# Patient Record
Sex: Female | Born: 1995 | Race: Black or African American | Hispanic: No | Marital: Single | Smoking: Never smoker
Health system: Southern US, Community
[De-identification: ages and names within clinical notes are randomized; demographics above are authoritative.]

## PROBLEM LIST (undated history)

## (undated) ENCOUNTER — Encounter

## (undated) ENCOUNTER — Encounter: Attending: Family | Primary: Family

## (undated) ENCOUNTER — Telehealth: Attending: Ambulatory Care | Primary: Ambulatory Care

## (undated) ENCOUNTER — Telehealth: Attending: Internal Medicine | Primary: Internal Medicine

## (undated) ENCOUNTER — Encounter: Attending: Ambulatory Care | Primary: Ambulatory Care

## (undated) ENCOUNTER — Telehealth

## (undated) ENCOUNTER — Encounter: Attending: Internal Medicine | Primary: Internal Medicine

## (undated) ENCOUNTER — Telehealth: Attending: Family | Primary: Family

## (undated) ENCOUNTER — Ambulatory Visit: Payer: TRICARE (CHAMPUS)

## (undated) ENCOUNTER — Ambulatory Visit

## (undated) ENCOUNTER — Encounter: Attending: Neurology | Primary: Neurology

## (undated) ENCOUNTER — Encounter: Attending: Advanced Practice Midwife | Primary: Advanced Practice Midwife

## (undated) ENCOUNTER — Ambulatory Visit: Payer: TRICARE (CHAMPUS) | Attending: Family Medicine | Primary: Family Medicine

## (undated) ENCOUNTER — Encounter: Payer: TRICARE (CHAMPUS) | Attending: Internal Medicine | Primary: Internal Medicine

## (undated) ENCOUNTER — Ambulatory Visit
Payer: TRICARE (CHAMPUS) | Attending: Student in an Organized Health Care Education/Training Program | Primary: Student in an Organized Health Care Education/Training Program

## (undated) ENCOUNTER — Ambulatory Visit: Payer: TRICARE (CHAMPUS) | Attending: Family | Primary: Family

## (undated) DIAGNOSIS — IMO0002 Reserved for concepts with insufficient information to code with codable children: Secondary | ICD-10-CM

## (undated) DIAGNOSIS — M329 Systemic lupus erythematosus, unspecified: Secondary | ICD-10-CM

## (undated) HISTORY — PX: APPENDECTOMY: SHX54

---
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
## 2019-03-07 ENCOUNTER — Encounter (HOSPITAL_COMMUNITY): Payer: Self-pay | Admitting: Emergency Medicine

## 2019-03-07 ENCOUNTER — Emergency Department (HOSPITAL_COMMUNITY)
Admission: EM | Admit: 2019-03-07 | Discharge: 2019-03-07 | Disposition: A | Discharge status: Home / Self Care | Attending: Emergency Medicine | Admitting: Emergency Medicine

## 2019-03-07 ENCOUNTER — Other Ambulatory Visit: Payer: Self-pay

## 2019-03-07 DIAGNOSIS — R300 Dysuria: Secondary | ICD-10-CM | POA: Insufficient documentation

## 2019-03-07 DIAGNOSIS — L298 Other pruritus: Secondary | ICD-10-CM | POA: Diagnosis not present

## 2019-03-07 DIAGNOSIS — R102 Pelvic and perineal pain: Secondary | ICD-10-CM | POA: Insufficient documentation

## 2019-03-07 LAB — URINALYSIS, COMPLETE (UACMP) WITH MICROSCOPIC
Bilirubin Urine: NEGATIVE
Glucose, UA: NEGATIVE mg/dL
Hgb urine dipstick: NEGATIVE
Ketones, ur: NEGATIVE mg/dL
Leukocytes,Ua: NEGATIVE
Nitrite: NEGATIVE
Protein, ur: NEGATIVE mg/dL
Specific Gravity, Urine: 1.019 (ref 1.005–1.030)
pH: 7 (ref 5.0–8.0)

## 2019-03-07 LAB — I-STAT BETA HCG BLOOD, ED (MC, WL, AP ONLY): I-stat hCG, quantitative: <5 m[IU]/mL (ref <5)

## 2019-03-07 LAB — WET PREP, GENITAL
Clue Cells Wet Prep HPF POC: NONE SEEN
Sperm: NONE SEEN
Trich, Wet Prep: NONE SEEN
Yeast Wet Prep HPF POC: NONE SEEN

## 2019-03-07 NOTE — Discharge Instructions (Signed)
You were evaluated in the Emergency Department and after careful evaluation, we did not find any emergent condition requiring admission or further testing in the hospital.  Your exam/testing today is overall reassuring.  You will be called with any abnormal test results.  We recommend follow-up with a GYN doctor.  Please return to the Emergency Department if you experience any worsening of your condition.  We encourage you to follow up with a primary care provider.  Thank you for allowing [place] to be a part of your care.

## 2019-03-07 NOTE — ED Provider Notes (Signed)
[facility]-EMERGENCY DEPT [facility] Emergency Department Provider Note MRN:  [id number]  Arrival date & time: 03/07/19     Chief Complaint   Vaginal Pain   History of Present Illness   Loany Neuroth is a 23 y.o. year-old female with no pertinent past medical history presenting to the ED with chief complaint of vaginal pain.  Vaginal discomfort, itching, burning with urination.  Unprotected sex 6 days ago.  No bleeding.  No fever.  No abdominal pain.  Pain is mild, constant, no exacerbating or alleviating factors.  Review of Systems  A problem-focused ROS was performed. Positive for vaginal pain.  Patient denies fever.  Patient's Health History   History reviewed. No pertinent past medical history.  History reviewed. No pertinent surgical history.  History reviewed. No pertinent family history.  Social History   Socioeconomic History  . Marital status: Single    Spouse name: Not on file  . Number of children: Not on file  . Years of education: Not on file  . Highest education level: Not on file  Occupational History  . Not on file  Social Needs  . Financial resource strain: Not on file  . Food insecurity    Worry: Not on file    Inability: Not on file  . Transportation needs    Medical: Not on file    Non-medical: Not on file  Tobacco Use  . Smoking status: Never Smoker  . Smokeless tobacco: Never Used  Substance and Sexual Activity  . Alcohol use: Yes  . Drug use: Never  . Sexual activity: Not on file  Lifestyle  . Physical activity    Days per week: Not on file    Minutes per session: Not on file  . Stress: Not on file  Relationships  . Social Musician on phone: Not on file    Gets together: Not on file    Attends religious service: Not on file    Active member of club or organization: Not on file    Attends meetings of clubs or organizations: Not on file    Relationship status: Not on file  . Intimate partner violence    Fear of current or  ex partner: Not on file    Emotionally abused: Not on file    Physically abused: Not on file    Forced sexual activity: Not on file  Other Topics Concern  . Not on file  Social History Narrative  . Not on file     Physical Exam  Vital Signs and Nursing Notes reviewed Vitals:   03/07/19 0048  BP: 107/70  Pulse: 83  Resp: 16  Temp: 98.9 F (37.2 C)  SpO2: 100%    CONSTITUTIONAL: Well-appearing, NAD NEURO:  Alert and oriented x 3, no focal deficits EYES:  eyes equal and reactive ENT/NECK:  no LAD, no JVD CARDIO: Regular rate, well-perfused, normal S1 and S2 PULM:  CTAB no wheezing or rhonchi GI/GU:  normal bowel sounds, non-distended, non-tender; normal-appearing external genitalia, no adnexal masses or tenderness, small amount of erythema to the cervix with surrounding white discharge MSK/SPINE:  No gross deformities, no edema SKIN:  no rash, atraumatic PSYCH:  Appropriate speech and behavior  Diagnostic and Interventional Summary    EKG Interpretation  Date/Time:    Ventricular Rate:    PR Interval:    QRS Duration:   QT Interval:    QTC Calculation:   R Axis:     Text Interpretation:  Labs Reviewed  WET PREP, GENITAL - Abnormal; Notable for the following components:      Result Value   WBC, Wet Prep HPF POC FEW (*)    All other components within normal limits  URINALYSIS, COMPLETE (UACMP) WITH MICROSCOPIC - Abnormal; Notable for the following components:   Bacteria, UA RARE (*)    All other components within normal limits  I-STAT BETA HCG BLOOD, ED ([facility], [facility], [facility] ONLY)  GC/CHLAMYDIA PROBE AMP ([place]) NOT AT [facility]    No orders to display    Medications - No data to display   Procedures  /  Critical Care Procedures  ED Course and Medical Decision Making  I have reviewed the triage vital signs and the nursing notes.  Pertinent labs & imaging results that were available during my care of the patient were reviewed by me and considered in my  medical decision making (see below for details).     Small amount of discharge and cervical irritation, exam not impressive enough to suggest PID but considering GC/chlamydia, trichomonas, BV.  Wet prep pending.  No clue cells on wet prep, appropriate for discharge.  Barth Kirks. Sedonia Small, MD [place] [email or web address]  Final Clinical Impressions(s) / ED Diagnoses     ICD-10-CM   1. Vaginal pain  R10.2     ED Discharge Orders    None       Discharge Instructions Discussed with and Provided to Patient:     Discharge Instructions     You were evaluated in the Emergency Department and after careful evaluation, we did not find any emergent condition requiring admission or further testing in the hospital.  Your exam/testing today is overall reassuring.  You will be called with any abnormal test results.  We recommend follow-up with a GYN doctor.  Please return to the Emergency Department if you experience any worsening of your condition.  We encourage you to follow up with a primary care provider.  Thank you for allowing [place] to be a part of your care.       Maudie Flakes, MD 03/07/19 [phone]

## 2019-03-07 NOTE — ED Triage Notes (Signed)
Patient complaining of vaginal pain, itching, burning when urinating, and patient had unprotected sex. Patient had unprotected sex on Friday.

## 2019-03-07 NOTE — ED Notes (Addendum)
Pt has the following tubes in Main lab:  3 gold tops 1 light green 1 lavender

## 2019-03-11 LAB — GC/CHLAMYDIA PROBE AMP (~~LOC~~) NOT AT ARMC
Chlamydia: NEGATIVE
Neisseria Gonorrhea: NEGATIVE

## 2019-11-04 DIAGNOSIS — M329 Systemic lupus erythematosus, unspecified: Principal | ICD-10-CM

## 2019-11-19 ENCOUNTER — Ambulatory Visit: Admit: 2019-11-19 | Discharge: 2019-11-20 | Payer: TRICARE (CHAMPUS)

## 2019-11-19 ENCOUNTER — Encounter: Admit: 2019-11-19 | Discharge: 2019-11-20 | Payer: TRICARE (CHAMPUS)

## 2019-11-19 DIAGNOSIS — R768 Other specified abnormal immunological findings in serum: Principal | ICD-10-CM

## 2019-11-19 DIAGNOSIS — M2550 Pain in unspecified joint: Principal | ICD-10-CM

## 2019-11-19 DIAGNOSIS — M329 Systemic lupus erythematosus, unspecified: Principal | ICD-10-CM

## 2019-11-22 DIAGNOSIS — M3219 Other organ or system involvement in systemic lupus erythematosus: Principal | ICD-10-CM

## 2019-11-22 MED ORDER — HYDROXYCHLOROQUINE 200 MG TABLET
ORAL_TABLET | Freq: Every day | ORAL | 2 refills | 30.00 days | Status: CP
Start: 2019-11-22 — End: 2020-02-20

## 2019-12-05 ENCOUNTER — Ambulatory Visit: Admit: 2019-12-05 | Discharge: 2019-12-06 | Payer: TRICARE (CHAMPUS)

## 2019-12-05 ENCOUNTER — Encounter: Admit: 2019-12-05 | Discharge: 2019-12-06 | Payer: TRICARE (CHAMPUS)

## 2019-12-06 DIAGNOSIS — D72819 Decreased white blood cell count, unspecified: Principal | ICD-10-CM

## 2019-12-06 MED ORDER — PREDNISONE 5 MG TABLET
ORAL_TABLET | Freq: Every day | ORAL | 3 refills | 90.00 days | Status: CP
Start: 2019-12-06 — End: 2020-12-05

## 2019-12-24 ENCOUNTER — Encounter: Admit: 2019-12-24 | Discharge: 2019-12-24 | Payer: TRICARE (CHAMPUS)

## 2019-12-24 ENCOUNTER — Ambulatory Visit: Admit: 2019-12-24 | Discharge: 2019-12-25 | Payer: TRICARE (CHAMPUS)

## 2019-12-24 DIAGNOSIS — M329 Systemic lupus erythematosus, unspecified: Principal | ICD-10-CM

## 2019-12-24 DIAGNOSIS — R109 Unspecified abdominal pain: Principal | ICD-10-CM

## 2019-12-24 MED ORDER — PANTOPRAZOLE 20 MG TABLET,DELAYED RELEASE
ORAL_TABLET | Freq: Every day | ORAL | 3 refills | 45.00000 days | Status: CP
Start: 2019-12-24 — End: 2020-06-21

## 2020-01-12 ENCOUNTER — Encounter (HOSPITAL_COMMUNITY): Payer: Self-pay | Admitting: Obstetrics and Gynecology

## 2020-01-12 ENCOUNTER — Other Ambulatory Visit: Payer: Self-pay

## 2020-01-12 ENCOUNTER — Emergency Department (HOSPITAL_COMMUNITY)
Admission: EM | Admit: 2020-01-12 | Discharge: 2020-01-12 | Disposition: A | Discharge status: Home / Self Care | Attending: Emergency Medicine | Admitting: Emergency Medicine

## 2020-01-12 ENCOUNTER — Emergency Department (HOSPITAL_COMMUNITY)

## 2020-01-12 DIAGNOSIS — R569 Unspecified convulsions: Secondary | ICD-10-CM | POA: Diagnosis present

## 2020-01-12 HISTORY — DX: Reserved for concepts with insufficient information to code with codable children: IMO0002

## 2020-01-12 HISTORY — DX: Systemic lupus erythematosus, unspecified: M32.9

## 2020-01-12 LAB — BASIC METABOLIC PANEL
Anion gap: 9 (ref 5–15)
BUN: 10 mg/dL (ref 6–20)
CO2: 28 mmol/L (ref 22–32)
Calcium: 10 mg/dL (ref 8.9–10.3)
Chloride: 101 mmol/L (ref 98–111)
Creatinine, Ser: 0.56 mg/dL (ref 0.44–1.00)
GFR calc Af Amer: >60 mL/min (ref >60)
GFR calc non Af Amer: >60 mL/min (ref >60)
Glucose, Bld: 78 mg/dL (ref 70–99)
Potassium: 3.6 mmol/L (ref 3.5–5.1)
Sodium: 138 mmol/L (ref 135–145)

## 2020-01-12 LAB — I-STAT BETA HCG BLOOD, ED (MC, WL, AP ONLY): I-stat hCG, quantitative: <5 m[IU]/mL (ref <5)

## 2020-01-12 LAB — CBC
HCT: 37.6 % (ref 36.0–46.0)
Hemoglobin: 13.2 g/dL (ref 12.0–15.0)
MCH: 32.4 pg (ref 26.0–34.0)
MCHC: 35.1 g/dL (ref 30.0–36.0)
MCV: 92.2 fL (ref 80.0–100.0)
Platelets: 241 10*3/uL (ref 150–400)
RBC: 4.08 MIL/uL (ref 3.87–5.11)
RDW: 11.4 % — ABNORMAL LOW (ref 11.5–15.5)
WBC: 4 10*3/uL (ref 4.0–10.5)
nRBC: 0 % (ref 0.0–0.2)

## 2020-01-12 LAB — URINALYSIS, ROUTINE W REFLEX MICROSCOPIC
Bilirubin Urine: NEGATIVE
Glucose, UA: NEGATIVE mg/dL
Hgb urine dipstick: NEGATIVE
Ketones, ur: NEGATIVE mg/dL
Leukocytes,Ua: NEGATIVE
Nitrite: NEGATIVE
Protein, ur: NEGATIVE mg/dL
Specific Gravity, Urine: 1.012 (ref 1.005–1.030)
pH: 7 (ref 5.0–8.0)

## 2020-01-12 NOTE — ED Provider Notes (Signed)
[facility]-EMERGENCY DEPT Provider Note   CSN: [id number] Arrival date & time: 01/12/20  0931     History Chief Complaint  Patient presents with  . Seizure like activity    Brian Kocourek is a 24 y.o. female.  Patient is a 23 year old female who presents with a possible seizure.  She states that in the early hours of the morning she had an episode where she was laying in bed and had some shaking.  She says that her jaw clenched up and she started shaking in her upper body and her legs.  She was awake the whole time.  She says it lasted less than a minute.  She has not had any further episodes.  She felt like she was almost having a panic attack.  She does have a history of anxiety but says she has not really been bothered by it lately.  She does have a headache.  She has been having some intermittent headaches over the last few weeks but says it is only maybe a little bit worse than her baseline headaches.  She does have a history of lupus and is on Plaquenil.  She is not currently on steroids.  She denies any recent illnesses.  No fevers or other recent symptoms.  No recent head trauma.  No history of seizures in the past.         Past Medical History:  Diagnosis Date  . Lupus (HCC)     There are no problems to display for this patient.   History reviewed. No pertinent surgical history.   OB History   No obstetric history on file.     No family history on file.  Social History   Tobacco Use  . Smoking status: Never Smoker  . Smokeless tobacco: Never Used  Vaping Use  . Vaping Use: Never used  Substance Use Topics  . Alcohol use: Yes  . Drug use: Never    Home Medications Prior to Admission medications   Medication Sig Start Date End Date Taking? Authorizing Provider  traZODone (DESYREL) 100 MG tablet Take 50 mg by mouth at bedtime as needed for sleep.    [provider]    Allergies    Amoxicillin and Penicillin g  Review of  Systems   Review of Systems  Constitutional: Negative for chills, diaphoresis, fatigue and fever.  HENT: Negative for congestion, rhinorrhea and sneezing.   Eyes: Negative.   Respiratory: Negative for cough, chest tightness and shortness of breath.   Cardiovascular: Negative for chest pain and leg swelling.  Gastrointestinal: Negative for abdominal pain, blood in stool, diarrhea, nausea and vomiting.  Genitourinary: Negative for difficulty urinating, flank pain, frequency and hematuria.  Musculoskeletal: Negative for arthralgias and back pain.  Skin: Negative for rash.  Neurological: Positive for seizures and headaches. Negative for dizziness, speech difficulty, weakness and numbness.    Physical Exam Updated Vital Signs BP 109/89   Pulse 88   Temp 98.4 F (36.9 C) (Oral)   Resp 16   Ht 5\' 4"  (1.626 m)   Wt 50.8 kg   LMP 06/15/2019 (Exact Date) Comment: Mirena IUD  SpO2 98%   BMI 19.22 kg/m   Physical Exam Constitutional:      Appearance: She is well-developed.  HENT:     Head: Normocephalic and atraumatic.  Eyes:     Pupils: Pupils are equal, round, and reactive to light.  Cardiovascular:     Rate and Rhythm: Normal rate and regular rhythm.  Heart sounds: Normal heart sounds.  Pulmonary:     Effort: Pulmonary effort is normal. No respiratory distress.     Breath sounds: Normal breath sounds. No wheezing or rales.  Chest:     Chest wall: No tenderness.  Abdominal:     General: Bowel sounds are normal.     Palpations: Abdomen is soft.     Tenderness: There is no abdominal tenderness. There is no guarding or rebound.  Musculoskeletal:        General: Normal range of motion.     Cervical back: Normal range of motion and neck supple.  Lymphadenopathy:     Cervical: No cervical adenopathy.  Skin:    General: Skin is warm and dry.     Findings: No rash.  Neurological:     Mental Status: She is alert and oriented to person, place, and time.     Comments: Motor 5/5  all extremities Sensation grossly intact to LT all extremities Finger to Nose intact, no pronator drift CN II-XII grossly intact Gait normal      ED Results / Procedures / Treatments   Labs (all labs ordered are listed, but only abnormal results are displayed) Labs Reviewed  CBC - Abnormal; Notable for the following components:      Result Value   RDW 11.4 (*)    All other components within normal limits  URINALYSIS, ROUTINE W REFLEX MICROSCOPIC - Abnormal; Notable for the following components:   Color, Urine STRAW (*)    All other components within normal limits  BASIC METABOLIC PANEL  I-STAT BETA HCG BLOOD, ED ([facility], [facility], [facility] ONLY)    EKG None  Radiology CT Head Wo Contrast  Result Date: 01/12/2020 CLINICAL DATA:  Seizure. EXAM: CT HEAD WITHOUT CONTRAST TECHNIQUE: Contiguous axial images were obtained from the base of the skull through the vertex without intravenous contrast. COMPARISON:  None. FINDINGS: Brain: No evidence of acute infarction, hemorrhage, hydrocephalus, extra-axial collection or mass lesion/mass effect. Vascular: No hyperdense vessel or unexpected calcification. Skull: Normal. Negative for fracture or focal lesion. Sinuses/Orbits: No acute finding. Other: None. IMPRESSION: Normal head CT. Electronically Signed   By: Lupita Raider M.D.   On: 01/12/2020 11:57    Procedures Procedures (including critical care time)  Medications Ordered in ED Medications - No data to display  ED Course  I have reviewed the triage vital signs and the nursing notes.  Pertinent labs & imaging results that were available during my care of the patient were reviewed by me and considered in my medical decision making (see chart for details).    MDM Rules/Calculators/A&P                          Patient is a 24 year old female who presents with seizure-like activity.  She was fully awake and alert the entire time.  It was very short-lived.  She has had a work-up in the ED  including labs which are nonconcerning.  Her head CT shows no acute abnormality.  She has had no further seizure activity.  She was discharged home in good condition.  I will give her a referral to follow-up with neurology.  I advised her and the typical seizure precautions including no driving, no swimming or bathing without supervision and no operation machinery until she is cleared by neurology.  Return precautions were given. Final Clinical Impression(s) / ED Diagnoses Final diagnoses:  Seizure-like activity (HCC)    Rx / DC  Orders ED Discharge Orders    None       Rolan Bucco, MD 01/12/20 1318

## 2020-01-12 NOTE — ED Triage Notes (Signed)
Patient reports she was laying down this morning and believes she "had a seizure". Patient reports it was a short episode lasting 1-2 minutes and she was aware of the entire time. Patient reports she felt scared and shakey and her jaw clenched up. Patient denies hx of seizures.

## 2020-01-12 NOTE — Discharge Instructions (Signed)
Make an appointment to follow-up with neurologist.  Refrain from driving, swimming or taking a bath without supervision or other dangerous activities until you are cleared by the neurologist.  Return here as needed for any worsening symptoms.

## 2020-01-16 ENCOUNTER — Ambulatory Visit
Admit: 2020-01-16 | Discharge: 2020-01-16 | Disposition: A | Discharge status: Home / Self Care | Payer: TRICARE (CHAMPUS) | Attending: Emergency Medicine

## 2020-01-16 ENCOUNTER — Encounter
Admit: 2020-01-16 | Discharge: 2020-01-16 | Disposition: A | Discharge status: Home / Self Care | Payer: TRICARE (CHAMPUS) | Attending: Emergency Medicine

## 2020-01-16 DIAGNOSIS — E162 Hypoglycemia, unspecified: Principal | ICD-10-CM

## 2020-01-16 DIAGNOSIS — F418 Other specified anxiety disorders: Principal | ICD-10-CM

## 2020-01-16 DIAGNOSIS — R5383 Other fatigue: Principal | ICD-10-CM

## 2020-01-17 DIAGNOSIS — R109 Unspecified abdominal pain: Principal | ICD-10-CM

## 2020-01-27 ENCOUNTER — Encounter: Admit: 2020-01-27 | Discharge: 2020-01-28 | Payer: TRICARE (CHAMPUS)

## 2020-02-10 DIAGNOSIS — R569 Unspecified convulsions: Principal | ICD-10-CM

## 2020-02-10 DIAGNOSIS — G459 Transient cerebral ischemic attack, unspecified: Principal | ICD-10-CM

## 2020-02-10 DIAGNOSIS — G458 Other transient cerebral ischemic attacks and related syndromes: Principal | ICD-10-CM

## 2020-02-14 ENCOUNTER — Ambulatory Visit: Admit: 2020-02-14 | Discharge: 2020-02-14 | Payer: TRICARE (CHAMPUS)

## 2020-02-14 DIAGNOSIS — R569 Unspecified convulsions: Principal | ICD-10-CM

## 2020-02-14 DIAGNOSIS — G4089 Other seizures: Principal | ICD-10-CM

## 2020-03-20 ENCOUNTER — Encounter: Admit: 2020-03-20 | Discharge: 2020-03-20 | Payer: TRICARE (CHAMPUS)

## 2020-03-20 DIAGNOSIS — G459 Transient cerebral ischemic attack, unspecified: Principal | ICD-10-CM

## 2020-03-20 DIAGNOSIS — G458 Other transient cerebral ischemic attacks and related syndromes: Principal | ICD-10-CM

## 2020-04-23 ENCOUNTER — Encounter
Admit: 2020-04-23 | Discharge: 2020-04-23 | Payer: TRICARE (CHAMPUS) | Attending: Internal Medicine | Primary: Internal Medicine

## 2020-04-23 DIAGNOSIS — M329 Systemic lupus erythematosus, unspecified: Principal | ICD-10-CM

## 2020-04-23 DIAGNOSIS — M2550 Pain in unspecified joint: Principal | ICD-10-CM

## 2020-04-23 DIAGNOSIS — R768 Other specified abnormal immunological findings in serum: Principal | ICD-10-CM

## 2020-04-23 MED ORDER — HYDROXYCHLOROQUINE 200 MG TABLET
ORAL_TABLET | Freq: Every day | ORAL | 3 refills | 90.00000 days | Status: CP
Start: 2020-04-23 — End: 2021-04-23

## 2020-04-23 MED ORDER — PREDNISONE 5 MG TABLET
ORAL_TABLET | ORAL | 0 refills | 42 days | Status: CP
Start: 2020-04-23 — End: 2020-06-04

## 2020-05-17 DIAGNOSIS — M329 Systemic lupus erythematosus, unspecified: Principal | ICD-10-CM

## 2020-05-17 DIAGNOSIS — M2550 Pain in unspecified joint: Principal | ICD-10-CM

## 2020-05-19 ENCOUNTER — Encounter
Admit: 2020-05-19 | Discharge: 2020-05-19 | Payer: TRICARE (CHAMPUS) | Attending: Internal Medicine | Primary: Internal Medicine

## 2020-05-19 ENCOUNTER — Encounter: Admit: 2020-05-19 | Discharge: 2020-05-19 | Payer: TRICARE (CHAMPUS)

## 2020-05-19 DIAGNOSIS — M2550 Pain in unspecified joint: Principal | ICD-10-CM

## 2020-05-19 DIAGNOSIS — M329 Systemic lupus erythematosus, unspecified: Principal | ICD-10-CM

## 2020-05-19 MED ORDER — FOLIC ACID 1 MG TABLET
ORAL_TABLET | Freq: Every day | ORAL | 3 refills | 90 days | Status: CP
Start: 2020-05-19 — End: 2021-05-19

## 2020-05-19 MED ORDER — METHOTREXATE SODIUM 2.5 MG TABLET
ORAL_TABLET | ORAL | 3 refills | 84.00000 days | Status: CP
Start: 2020-05-19 — End: ?

## 2020-06-08 DIAGNOSIS — R87612 Low grade squamous intraepithelial lesion on cytologic smear of cervix (LGSIL): Principal | ICD-10-CM

## 2020-07-08 ENCOUNTER — Encounter: Admit: 2020-07-08 | Discharge: 2020-07-09 | Payer: TRICARE (CHAMPUS)

## 2020-07-08 DIAGNOSIS — Z01818 Encounter for other preprocedural examination: Principal | ICD-10-CM

## 2020-07-08 DIAGNOSIS — R87612 Low grade squamous intraepithelial lesion on cytologic smear of cervix (LGSIL): Principal | ICD-10-CM

## 2020-08-20 ENCOUNTER — Encounter
Admit: 2020-08-20 | Discharge: 2020-08-21 | Payer: TRICARE (CHAMPUS) | Attending: Internal Medicine | Primary: Internal Medicine

## 2020-08-20 DIAGNOSIS — M329 Systemic lupus erythematosus, unspecified: Principal | ICD-10-CM

## 2020-08-20 DIAGNOSIS — F419 Anxiety disorder, unspecified: Principal | ICD-10-CM

## 2020-08-20 MED ORDER — PREDNISONE 5 MG TABLET
ORAL_TABLET | Freq: Every day | ORAL | 2 refills | 90 days | Status: CP
Start: 2020-08-20 — End: ?

## 2020-08-20 MED ORDER — PREDNISONE 1 MG TABLET
ORAL_TABLET | ORAL | 1 refills | 56 days | Status: CP
Start: 2020-08-20 — End: 2020-10-15

## 2020-08-20 MED ORDER — BUPROPION HCL XL 150 MG 24 HR TABLET, EXTENDED RELEASE
ORAL_TABLET | Freq: Every morning | ORAL | 3 refills | 90.00000 days | Status: CP
Start: 2020-08-20 — End: 2021-08-20

## 2020-10-02 ENCOUNTER — Ambulatory Visit (HOSPITAL_COMMUNITY)
Admission: EM | Admit: 2020-10-02 | Discharge: 2020-10-02 | Disposition: A | Discharge status: Home / Self Care | Attending: Urology | Admitting: Urology

## 2020-10-02 ENCOUNTER — Other Ambulatory Visit: Payer: Self-pay

## 2020-10-02 DIAGNOSIS — F411 Generalized anxiety disorder: Secondary | ICD-10-CM | POA: Diagnosis not present

## 2020-10-02 DIAGNOSIS — F331 Major depressive disorder, recurrent, moderate: Secondary | ICD-10-CM

## 2020-10-02 MED ORDER — HYDROXYZINE HCL 25 MG PO TABS: 25.0000 mg | ORAL_TABLET | Freq: Three times a day (TID) | ORAL | 1 refills | Status: DC | PRN

## 2020-10-02 NOTE — Discharge Instructions (Addendum)

## 2020-10-02 NOTE — BH Assessment (Signed)
Patient reports to [facility] with partner reporting passive SI .No plan or intent and can contract safety. Patient denies HI/ AVH or substance use . 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 Patient recently started medication for anxiety less than a month ago prescribed by Rheumatologist . Patient requesting resources . Patient is routine.
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
## 2020-10-02 NOTE — ED Notes (Addendum)
Pt discharged in no acute distress. A&O x4, ambulatory. Verbalized understanding of AVS instructions reviewed by RN. Belongings returned to pt intact from "orange" locker. Pt and her partner escorted to front lobby by staff. Safety maintained.

## 2020-10-05 ENCOUNTER — Ambulatory Visit
Admit: 2020-10-05 | Discharge: 2020-10-06 | Payer: TRICARE (CHAMPUS) | Attending: Advanced Practice Midwife | Primary: Advanced Practice Midwife

## 2020-10-05 DIAGNOSIS — M329 Systemic lupus erythematosus, unspecified: Principal | ICD-10-CM

## 2020-10-05 DIAGNOSIS — Z7689 Persons encountering health services in other specified circumstances: Principal | ICD-10-CM

## 2020-10-05 MED ORDER — HYDROXYCHLOROQUINE 200 MG TABLET
ORAL_TABLET | Freq: Every day | ORAL | 3 refills | 90 days | Status: CP
Start: 2020-10-05 — End: 2021-10-05

## 2020-10-05 MED ORDER — FOLIC ACID 1 MG TABLET
ORAL_TABLET | Freq: Every day | ORAL | 3 refills | 90 days | Status: CP
Start: 2020-10-05 — End: 2021-10-05

## 2020-10-05 MED ORDER — METHOTREXATE SODIUM 2.5 MG TABLET
ORAL_TABLET | ORAL | 3 refills | 84 days | Status: CP
Start: 2020-10-05 — End: ?

## 2020-10-22 ENCOUNTER — Emergency Department (HOSPITAL_COMMUNITY)
Admission: EM | Admit: 2020-10-22 | Discharge: 2020-10-22 | Disposition: A | Discharge status: Home / Self Care | Attending: Emergency Medicine | Admitting: Emergency Medicine

## 2020-10-22 ENCOUNTER — Other Ambulatory Visit: Payer: Self-pay

## 2020-10-22 ENCOUNTER — Emergency Department (HOSPITAL_COMMUNITY)

## 2020-10-22 ENCOUNTER — Encounter (HOSPITAL_COMMUNITY): Payer: Self-pay

## 2020-10-22 DIAGNOSIS — M329 Systemic lupus erythematosus, unspecified: Principal | ICD-10-CM

## 2020-10-22 DIAGNOSIS — R110 Nausea: Secondary | ICD-10-CM | POA: Insufficient documentation

## 2020-10-22 DIAGNOSIS — R0602 Shortness of breath: Secondary | ICD-10-CM | POA: Diagnosis not present

## 2020-10-22 DIAGNOSIS — R079 Chest pain, unspecified: Secondary | ICD-10-CM

## 2020-10-22 DIAGNOSIS — R000 Tachycardia, unspecified: Secondary | ICD-10-CM | POA: Insufficient documentation

## 2020-10-22 DIAGNOSIS — R0789 Other chest pain: Secondary | ICD-10-CM | POA: Insufficient documentation

## 2020-10-22 LAB — BASIC METABOLIC PANEL
Anion gap: 7 (ref 5–15)
BUN: 10 mg/dL (ref 6–20)
CO2: 26 mmol/L (ref 22–32)
Calcium: 9.6 mg/dL (ref 8.9–10.3)
Chloride: 104 mmol/L (ref 98–111)
Creatinine, Ser: 0.61 mg/dL (ref 0.44–1.00)
GFR, Estimated: >60 mL/min (ref >60)
Glucose, Bld: 88 mg/dL (ref 70–99)
Potassium: 3.5 mmol/L (ref 3.5–5.1)
Sodium: 137 mmol/L (ref 135–145)

## 2020-10-22 LAB — CBC
HCT: 37.8 % (ref 36.0–46.0)
Hemoglobin: 13.5 g/dL (ref 12.0–15.0)
MCH: 33.9 pg (ref 26.0–34.0)
MCHC: 35.7 g/dL (ref 30.0–36.0)
MCV: 95 fL (ref 80.0–100.0)
Platelets: 190 10*3/uL (ref 150–400)
RBC: 3.98 MIL/uL (ref 3.87–5.11)
RDW: 11.4 % — ABNORMAL LOW (ref 11.5–15.5)
WBC: 3 10*3/uL — ABNORMAL LOW (ref 4.0–10.5)
nRBC: 0 % (ref 0.0–0.2)

## 2020-10-22 LAB — TROPONIN I (HIGH SENSITIVITY)
Troponin I (High Sensitivity): <2 ng/L (ref <18)
Troponin I (High Sensitivity): <2 ng/L (ref <18)

## 2020-10-22 LAB — I-STAT BETA HCG BLOOD, ED (MC, WL, AP ONLY): I-stat hCG, quantitative: <5 m[IU]/mL (ref <5)

## 2020-10-22 LAB — D-DIMER, QUANTITATIVE: D-Dimer, Quant: 0.65 ug/mL-FEU — ABNORMAL HIGH (ref 0.00–0.50)

## 2020-10-22 MED ORDER — SODIUM CHLORIDE (PF) 0.9 % IJ SOLN
INTRAMUSCULAR | Status: AC
  Filled 2020-10-22: qty 50

## 2020-10-22 MED ORDER — IOHEXOL 350 MG/ML SOLN
80.0000 mL | Freq: Once | INTRAVENOUS | Status: AC | PRN
  Administered 2020-10-22: 80 mL via INTRAVENOUS

## 2020-10-22 NOTE — ED Triage Notes (Signed)
Patient reports that she has had intermittent mid and left chest pain and nausea since yesterday. Patient denies any SOB or dizziness.

## 2020-10-22 NOTE — ED Provider Notes (Signed)
[facility]-EMERGENCY DEPT Provider Note   CSN: [id number] Arrival date & time: 10/22/20  1345     History Chief Complaint  Patient presents with   Chest Pain    Tonya Gaines is a 25 y.o. female.  HPI Patient is a 25 year old female with a history of lupus who presents to the emergency department due to chest pain/tightness.  Symptoms started yesterday.  She states that she initially began experiencing intermittent left-sided sharp chest pain.  Typically occurs when sitting/lying and she feels that it improves in frequency when standing.  Last occurred around noon today.  States that when it worsens it radiates down her left arm.  Reports associated constant chest tightness that is diffuse along the anterior chest wall.  Mild shortness of breath and nausea.  Denies any current shortness of breath or nausea.  No vomiting, hemoptysis, leg swelling, history of blood clots.  She has an IUD in place but denies any p.o. estrogen use.    Past Medical History:  Diagnosis Date   Lupus (HCC)     There are no problems to display for this patient.   Past Surgical History:  Procedure Laterality Date   APPENDECTOMY       OB History   No obstetric history on file.     Family History  Problem Relation Age of Onset   Healthy Mother    Healthy Father     Social History   Tobacco Use   Smoking status: Never   Smokeless tobacco: Never  Vaping Use   Vaping Use: Never used  Substance Use Topics   Alcohol use: Yes   Drug use: Not Currently    Home Medications Prior to Admission medications   Medication Sig Start Date End Date Taking? Authorizing Provider  hydrOXYzine (ATARAX/VISTARIL) 25 MG tablet Take 1 tablet (25 mg total) by mouth 3 (three) times daily as needed for anxiety. 10/02/20   Ajibola, Gerrianne Scale A, NP  traZODone (DESYREL) 100 MG tablet Take 50 mg by mouth at bedtime as needed for sleep.    [provider]    Allergies    Amoxicillin and  Penicillin g  Review of Systems   Review of Systems  All other systems reviewed and are negative. Ten systems reviewed and are negative for acute change, except as noted in the HPI.   Physical Exam Updated Vital Signs BP 114/78   Pulse 93   Temp 98.3 F (36.8 C) (Oral)   Resp 16   Ht 5\' 4"  (1.626 m)   Wt 53.5 kg   SpO2 100%   BMI 20.25 kg/m   Physical Exam Vitals and nursing note reviewed.  Constitutional:      General: She is not in acute distress.    Appearance: Normal appearance. She is well-developed and normal weight. She is not ill-appearing, toxic-appearing or diaphoretic.  HENT:     Head: Normocephalic and atraumatic.     Right Ear: External ear normal.     Left Ear: External ear normal.     Nose: Nose normal.     Mouth/Throat:     Mouth: Mucous membranes are moist.     Pharynx: Oropharynx is clear. No oropharyngeal exudate or posterior oropharyngeal erythema.  Eyes:     Extraocular Movements: Extraocular movements intact.  Cardiovascular:     Rate and Rhythm: Regular rhythm. Tachycardia present.     Pulses: Normal pulses.          Radial pulses are 2+ on the  right side and 2+ on the left side.       Dorsalis pedis pulses are 2+ on the right side and 2+ on the left side.     Heart sounds: Normal heart sounds. Heart sounds not distant. No murmur heard. No systolic murmur is present.  No diastolic murmur is present.    No friction rub. No gallop. No S3 or S4 sounds.  Pulmonary:     Effort: Pulmonary effort is normal. No tachypnea, accessory muscle usage or respiratory distress.     Breath sounds: Normal breath sounds. No stridor. No decreased breath sounds, wheezing, rhonchi or rales.  Abdominal:     General: Abdomen is flat.     Tenderness: There is no abdominal tenderness.  Musculoskeletal:        General: Normal range of motion.     Cervical back: Normal range of motion and neck supple. No tenderness.     Right lower leg: No edema.     Left lower leg: No  edema.  Skin:    General: Skin is warm and dry.  Neurological:     General: No focal deficit present.     Mental Status: She is alert and oriented to person, place, and time.  Psychiatric:        Mood and Affect: Mood normal.        Behavior: Behavior normal.    ED Results / Procedures / Treatments   Labs (all labs ordered are listed, but only abnormal results are displayed) Labs Reviewed  CBC - Abnormal; Notable for the following components:      Result Value   WBC 3.0 (*)    RDW 11.4 (*)    All other components within normal limits  D-DIMER, QUANTITATIVE - Abnormal; Notable for the following components:   D-Dimer, Quant 0.65 (*)    All other components within normal limits  BASIC METABOLIC PANEL  I-STAT BETA HCG BLOOD, ED ([facility], [facility], [facility] ONLY)  TROPONIN I (HIGH SENSITIVITY)  TROPONIN I (HIGH SENSITIVITY)   EKG EKG Interpretation  Date/Time:  Thursday October 22 2020 14:07:51 EDT Ventricular Rate:  100 PR Interval:  112 QRS Duration: 82 QT Interval:  350 QTC Calculation: 451 R Axis:   65 Text Interpretation: Normal sinus rhythm Normal ECG No previous tracing Confirmed by Gwyneth Sprout ([id number]) on 10/22/2020 2:56:06 PM  Radiology DG Chest 2 View  Result Date: 10/22/2020 CLINICAL DATA:  Chest pain, intermittent mid and left sided chest pain and nausea since yesterday. EXAM: CHEST - 2 VIEW COMPARISON:  None. FINDINGS: The heart size and mediastinal contours are within normal limits. No focal consolidation. No pleural effusion. No pneumothorax. The visualized skeletal structures are unremarkable. IMPRESSION: No active cardiopulmonary disease. Electronically Signed   By: Maudry Mayhew MD   On: 10/22/2020 16:28   CT Angio Chest PE W and/or Wo Contrast  Result Date: 10/22/2020 CLINICAL DATA:  Chest pain and pressure EXAM: CT ANGIOGRAPHY CHEST WITH CONTRAST TECHNIQUE: Multidetector CT imaging of the chest was performed using the standard protocol during bolus administration of  intravenous contrast. Multiplanar CT image reconstructions and MIPs were obtained to evaluate the vascular anatomy. CONTRAST:  2mL OMNIPAQUE IOHEXOL 350 MG/ML SOLN COMPARISON:  Chest from earlier in the same day. FINDINGS: Cardiovascular: Atherosclerotic calcifications are noted in the thoracic aorta and its branches. No aneurysmal dilatation or dissection is noted. No cardiac enlargement is seen. The pulmonary artery is well visualized within normal branching pattern bilaterally. No intraluminal filling defect to suggest  pulmonary embolism is noted. No coronary calcifications are seen. Mediastinum/Nodes: Thoracic inlet is within normal limits. No sizable hilar or mediastinal adenopathy is noted. The esophagus is within normal limits. Lungs/Pleura: Lungs are well aerated bilaterally. No focal infiltrate or sizable effusion is seen. No parenchymal nodules are noted. Upper Abdomen: Visualized upper abdomen is within normal limits. Musculoskeletal: No chest wall abnormality. No acute or significant osseous findings. Review of the MIP images confirms the above findings. IMPRESSION: No evidence of pulmonary emboli. No acute abnormality noted. Aortic Atherosclerosis (ICD10-I70.0). Electronically Signed   By: Alcide Clever M.D.   On: 10/22/2020 17:24    Procedures Procedures   Medications Ordered in ED Medications  sodium chloride (PF) 0.9 % injection (  Not Given 10/22/20 1705)  iohexol (OMNIPAQUE) 350 MG/ML injection 80 mL (80 mLs Intravenous Contrast Given 10/22/20 1644)    ED Course  I have reviewed the triage vital signs and the nursing notes.  Pertinent labs & imaging results that were available during my care of the patient were reviewed by me and considered in my medical decision making (see chart for details).    MDM Rules/Calculators/A&P                          Pt is a 25 y.o. female who presents to the emergency department with chest tightness and shortness of breath that started  yesterday.  Labs: CBC with a white blood cell count of 3 and an RDW of 11.4. BMP within normal limits. Troponin less than 2. I-STAT beta-hCG less than 5. Mildly elevated D-dimer at 0.65.  Imaging: CTA of the chest shows no evidence of pulmonary emboli.  No acute abnormality is noted.  Aortic atherosclerosis. Chest x-ray is negative.  I, Placido Sou, PA-C, personally reviewed and evaluated these images and lab results as part of my medical decision-making.  Unsure the source of the patient's symptoms.  Chest x-ray, ECG, as well as troponin is reassuring.  Given patient's age and lack of risk factors as well as her reassuring work-up, doubt ACS at this time.  Patient did have a mildly elevated D-dimer at 0.65 so I obtained a CTA of the chest which was negative for PE.  Feel the patient is stable for discharge at this time and she is agreeable.  Recommended she follow-up with her regular doctor regarding her symptoms and return to the emergency department if her symptoms should worsen.  Her questions were answered and she was amicable at the time of discharge.  Note: Portions of this report may have been transcribed using voice recognition software. Every effort was made to ensure accuracy; however, inadvertent computerized transcription errors may be present.   Final Clinical Impression(s) / ED Diagnoses Final diagnoses:  Chest pain, unspecified type   Rx / DC Orders ED Discharge Orders     None        Placido Sou, PA-C 10/22/20 1735    Koleen Distance, MD 10/22/20 1920

## 2020-10-22 NOTE — Discharge Instructions (Addendum)
Please continue to monitor symptoms closely. If you develop any new or worsening symptoms please do not hesitate to return to the emergency department. It was a pleasure to meet you.

## 2020-10-22 NOTE — ED Provider Notes (Signed)
Emergency Medicine Provider Triage Evaluation Note  Tonya Gaines , a 25 y.o. female  was evaluated in triage.  Pt complains of chest pain.  Chest pain has been intermittent since yesterday.  Patient describes pain as a sharp pain to the left side of her chest and pressure throughout her entire chest.  Patient denies any chest pain at present.  Experienced chest pain at noon today.  Pain lasts for few minutes and then resolves.  Patient endorses associated nausea.  No associated shortness of breath, diaphoresis, vomiting.  Patient has history of lupus and is on medication making her immunosuppressed.  Patient has IUD in place.  Review of Systems  Positive: Chest pain, nausea Negative: Fever, chills, URI symptoms, abdominal pain, leg swelling, palpitations, shortness of breath, vomiting  Physical Exam  BP 112/81 (BP Location: Left Arm)   Pulse (!) 102   Temp 98.3 F (36.8 C) (Oral)   Resp 14   Ht 5\' 4"  (1.626 m)   Wt 53.5 kg   SpO2 98%   BMI 20.25 kg/m  Gen:   Awake, no distress   Resp:  Normal effort, lungs clear to auscultation bilaterally MSK:   Moves extremities without difficulty  Other:  S1, S2 present with no murmurs rubs or gallops.  No swelling, edema, or tenderness to bilateral lower extremities.  Medical Decision Making  Medically screening exam initiated at 2:18 PM.  Appropriate orders placed.  Tonya Gaines was informed that the remainder of the evaluation will be completed by another provider, this initial triage assessment does not replace that evaluation, and the importance of remaining in the ED until their evaluation is complete.  The patient appears stable so that the remainder of the work up may be completed by another provider.      10/22/20 1420    10/24/20, MD 10/25/20 2204

## 2020-10-26 DIAGNOSIS — M2550 Pain in unspecified joint: Principal | ICD-10-CM

## 2020-10-28 DIAGNOSIS — N3000 Acute cystitis without hematuria: Principal | ICD-10-CM

## 2020-10-28 MED ORDER — CIPROFLOXACIN 500 MG TABLET
ORAL_TABLET | Freq: Two times a day (BID) | ORAL | 0 refills | 5.00000 days | Status: CP
Start: 2020-10-28 — End: 2020-11-02

## 2020-12-02 DIAGNOSIS — N390 Urinary tract infection, site not specified: Principal | ICD-10-CM

## 2021-01-06 ENCOUNTER — Ambulatory Visit: Admit: 2021-01-06 | Discharge: 2021-01-07 | Payer: TRICARE (CHAMPUS) | Attending: Family | Primary: Family

## 2021-01-06 DIAGNOSIS — M329 Systemic lupus erythematosus, unspecified: Principal | ICD-10-CM

## 2021-01-06 DIAGNOSIS — Z79899 Other long term (current) drug therapy: Principal | ICD-10-CM

## 2021-01-06 MED ORDER — PREDNISONE 5 MG TABLET
ORAL_TABLET | ORAL | 0 refills | 12 days | Status: CP
Start: 2021-01-06 — End: 2021-01-18

## 2021-01-06 MED ORDER — LEUCOVORIN CALCIUM 5 MG TABLET
ORAL_TABLET | 2 refills | 0 days | Status: CP
Start: 2021-01-06 — End: ?

## 2021-01-21 DIAGNOSIS — Z79631 Methotrexate, long term, current use: Principal | ICD-10-CM

## 2021-01-21 MED ORDER — LEUCOVORIN CALCIUM 5 MG TABLET
ORAL_TABLET | ORAL | 3 refills | 84.00000 days | Status: CP
Start: 2021-01-21 — End: 2022-01-21

## 2021-01-29 ENCOUNTER — Emergency Department (HOSPITAL_COMMUNITY)
Admission: EM | Admit: 2021-01-29 | Discharge: 2021-01-29 | Disposition: A | Discharge status: Home / Self Care | Attending: Emergency Medicine | Admitting: Emergency Medicine

## 2021-01-29 ENCOUNTER — Emergency Department (HOSPITAL_COMMUNITY)

## 2021-01-29 ENCOUNTER — Encounter (HOSPITAL_COMMUNITY): Payer: Self-pay | Admitting: Emergency Medicine

## 2021-01-29 DIAGNOSIS — R52 Pain, unspecified: Secondary | ICD-10-CM

## 2021-01-29 DIAGNOSIS — N83201 Unspecified ovarian cyst, right side: Secondary | ICD-10-CM | POA: Insufficient documentation

## 2021-01-29 DIAGNOSIS — R102 Pelvic and perineal pain: Secondary | ICD-10-CM | POA: Diagnosis present

## 2021-01-29 LAB — CBC WITH DIFFERENTIAL/PLATELET
Abs Immature Granulocytes: 0 K/uL (ref 0.00–0.07)
Basophils Absolute: 0 K/uL (ref 0.0–0.1)
Basophils Relative: 1 %
Eosinophils Absolute: 0 K/uL (ref 0.0–0.5)
Eosinophils Relative: 1 %
HCT: 38.1 % (ref 36.0–46.0)
Hemoglobin: 13.1 g/dL (ref 12.0–15.0)
Immature Granulocytes: 0 %
Lymphocytes Relative: 32 %
Lymphs Abs: 0.8 K/uL (ref 0.7–4.0)
MCH: 33 pg (ref 26.0–34.0)
MCHC: 34.4 g/dL (ref 30.0–36.0)
MCV: 96 fL (ref 80.0–100.0)
Monocytes Absolute: 0.4 K/uL (ref 0.1–1.0)
Monocytes Relative: 14 %
Neutro Abs: 1.3 K/uL — ABNORMAL LOW (ref 1.7–7.7)
Neutrophils Relative %: 52 %
Platelets: 197 K/uL (ref 150–400)
RBC: 3.97 MIL/uL (ref 3.87–5.11)
RDW: 11.1 % — ABNORMAL LOW (ref 11.5–15.5)
WBC: 2.6 K/uL — ABNORMAL LOW (ref 4.0–10.5)
nRBC: 0 % (ref 0.0–0.2)

## 2021-01-29 LAB — COMPREHENSIVE METABOLIC PANEL
ALT: 10 U/L (ref 0–44)
AST: 17 U/L (ref 15–41)
Albumin: 4.6 g/dL (ref 3.5–5.0)
Alkaline Phosphatase: 46 U/L (ref 38–126)
Anion gap: 6 (ref 5–15)
BUN: 9 mg/dL (ref 6–20)
CO2: 28 mmol/L (ref 22–32)
Calcium: 9.4 mg/dL (ref 8.9–10.3)
Chloride: 104 mmol/L (ref 98–111)
Creatinine, Ser: 0.51 mg/dL (ref 0.44–1.00)
GFR, Estimated: >60 mL/min (ref >60)
Glucose, Bld: 74 mg/dL (ref 70–99)
Potassium: 3.5 mmol/L (ref 3.5–5.1)
Sodium: 138 mmol/L (ref 135–145)
Total Bilirubin: 0.8 mg/dL (ref 0.3–1.2)
Total Protein: 8.5 g/dL — ABNORMAL HIGH (ref 6.5–8.1)

## 2021-01-29 LAB — URINALYSIS, ROUTINE W REFLEX MICROSCOPIC
Bilirubin Urine: NEGATIVE
Glucose, UA: NEGATIVE mg/dL
Hgb urine dipstick: NEGATIVE
Ketones, ur: NEGATIVE mg/dL
Leukocytes,Ua: NEGATIVE
Nitrite: NEGATIVE
Protein, ur: NEGATIVE mg/dL
Specific Gravity, Urine: 1.017 (ref 1.005–1.030)
pH: 5 (ref 5.0–8.0)

## 2021-01-29 LAB — I-STAT BETA HCG BLOOD, ED (MC, WL, AP ONLY): I-stat hCG, quantitative: <5 m[IU]/mL (ref <5)

## 2021-01-29 LAB — HIV ANTIBODY (ROUTINE TESTING W REFLEX): HIV Screen 4th Generation wRfx: NONREACTIVE

## 2021-01-29 MED ORDER — KETOROLAC TROMETHAMINE 60 MG/2ML IM SOLN
60.0000 mg | Freq: Once | INTRAMUSCULAR | Status: AC
  Administered 2021-01-29: 60 mg via INTRAMUSCULAR
  Filled 2021-01-29: qty 2

## 2021-01-29 NOTE — ED Notes (Signed)
Pt gone to ultrasound. Will get labs when pt returns to the room

## 2021-01-29 NOTE — ED Provider Notes (Signed)
Emergency Medicine Provider Triage Evaluation Note  Tonya Gaines , a 25 y.o. female  was evaluated in triage.  Pt complains of pelvic pain.  Review of Systems  Positive: Right pelvic pain, nausea Negative: Fever, dysuria, hematuria, vaginal discharge, vaginal bleeding  Physical Exam  BP 126/88 (BP Location: Left Arm)   Pulse 97   Temp 99.1 F (37.3 C) (Oral)   Resp 19   SpO2 100%  Gen:   Awake, no distress   Resp:  Normal effort  MSK:   Moves extremities without difficulty  Other:    Medical Decision Making  Medically screening exam initiated at 12:20 PM.  Appropriate orders placed.  Tonya Gaines was informed that the remainder of the evaluation will be completed by another provider, this initial triage assessment does not replace that evaluation, and the importance of remaining in the ED until their evaluation is complete.  Pt here with acute onset of pain to R lower abd/pelvis region.  Prior appendectomy in 2020.     Fayrene Helper, PA-C 01/29/21 1225    Tonya Berkshire, MD 01/30/21 [phone]

## 2021-01-29 NOTE — Discharge Instructions (Addendum)
You will need a repeat ultrasound of your ovary and 6 weeks to check for resolution of the cyst

## 2021-01-29 NOTE — ED Triage Notes (Signed)
Patient reports right sided sharp pelvic pain since 3am this morning. Denies bleeding. Denies pregnancy.

## 2021-01-29 NOTE — ED Provider Notes (Signed)
[name]   CSN: [id number] Arrival date & time: 01/29/21  1156     History Chief Complaint  Patient presents with   Pelvic Pain    Tonya Gaines is a 25 y.o. female.  25 year old female who is status post appendectomy presents with sudden onset of right lower quadrant pain this morning.  Notes that she has IUD and is unsure when her last period was.  Denies any urinary symptoms.  No flank pain.  No vaginal bleeding or discharge.  Pain is characterizes sharp and worse with movements.  No treatment use prior to arrival      Past Medical History:  Diagnosis Date   Lupus (HCC)     There are no problems to display for this patient.   Past Surgical History:  Procedure Laterality Date   APPENDECTOMY       OB History   No obstetric history on file.     Family History  Problem Relation Age of Onset   Healthy Mother    Healthy Father     Social History   Tobacco Use   Smoking status: Never   Smokeless tobacco: Never  Vaping Use   Vaping Use: Never used  Substance Use Topics   Alcohol use: Yes   Drug use: Not Currently    Home Medications Prior to Admission medications   Medication Sig Start Date End Date Taking? Authorizing Provider  hydroxychloroquine (PLAQUENIL) 200 MG tablet Take 200 mg by mouth every evening. 11/06/20  Yes [provider]  leucovorin (WELLCOVORIN) 5 MG tablet Take 10 mg by mouth once a week. 01/21/21  Yes [provider]  methotrexate (RHEUMATREX) 2.5 MG tablet Take 15 mg by mouth once a week. 10/31/20  Yes [provider]  buPROPion (WELLBUTRIN XL) 150 MG 24 hr tablet Take 150 mg by mouth daily as needed. 12/10/20   [provider]  folic acid (FOLVITE) 1 MG tablet Take 1 mg by mouth daily. 11/10/20   [provider]  hydrOXYzine (ATARAX/VISTARIL) 25 MG tablet Take 1 tablet (25 mg total) by mouth 3 (three) times daily as needed for anxiety. 10/02/20    Ajibola, Gerrianne Scale A, NP  hydrOXYzine (VISTARIL) 25 MG capsule Take 25 mg by mouth 3 (three) times daily. 10/07/20   [provider]  mirtazapine (REMERON) 15 MG tablet Take 15 mg by mouth at bedtime. 10/07/20   [provider]  sertraline (ZOLOFT) 100 MG tablet Take by mouth. 12/03/20   [provider]  traZODone (DESYREL) 100 MG tablet Take 50 mg by mouth at bedtime as needed for sleep.    [provider]    Allergies    Amoxicillin and Penicillin g  Review of Systems   Review of Systems  All other systems reviewed and are negative.  Physical Exam Updated Vital Signs BP 111/85   Pulse 86   Temp 99.1 F (37.3 C) (Oral)   Resp 18   SpO2 95%   Physical Exam Vitals and nursing note reviewed.  Constitutional:      General: She is not in acute distress.    Appearance: Normal appearance. She is well-developed. She is not toxic-appearing.  HENT:     Head: Normocephalic and atraumatic.  Eyes:     General: Lids are normal.     Conjunctiva/sclera: Conjunctivae normal.     Pupils: Pupils are equal, round, and reactive to light.  Neck:     Thyroid: No thyroid mass.  Trachea: No tracheal deviation.  Cardiovascular:     Rate and Rhythm: Normal rate and regular rhythm.     Heart sounds: Normal heart sounds. No murmur heard.   No gallop.  Pulmonary:     Effort: Pulmonary effort is normal. No respiratory distress.     Breath sounds: Normal breath sounds. No stridor. No decreased breath sounds, wheezing, rhonchi or rales.  Abdominal:     General: There is no distension.     Palpations: Abdomen is soft.     Tenderness: There is abdominal tenderness in the right lower quadrant. There is no guarding or rebound.    Musculoskeletal:        General: No tenderness. Normal range of motion.     Cervical back: Normal range of motion and neck supple.  Skin:    General: Skin is warm and dry.     Findings: No abrasion or rash.  Neurological:     Mental  Status: She is alert and oriented to person, place, and time. Mental status is at baseline.     GCS: GCS eye subscore is 4. GCS verbal subscore is 5. GCS motor subscore is 6.     Cranial Nerves: No cranial nerve deficit.     Sensory: No sensory deficit.     Motor: Motor function is intact.  Psychiatric:        Attention and Perception: Attention normal.        Speech: Speech normal.        Behavior: Behavior normal.    ED Results / Procedures / Treatments   Labs (all labs ordered are listed, but only abnormal results are displayed) Labs Reviewed  CBC WITH DIFFERENTIAL/PLATELET - Abnormal; Notable for the following components:      Result Value   WBC 2.6 (*)    RDW 11.1 (*)    Neutro Abs 1.3 (*)    All other components within normal limits  COMPREHENSIVE METABOLIC PANEL - Abnormal; Notable for the following components:   Total Protein 8.5 (*)    All other components within normal limits  WET PREP, GENITAL  URINALYSIS, ROUTINE W REFLEX MICROSCOPIC  RPR  HIV ANTIBODY (ROUTINE TESTING W REFLEX)  I-STAT BETA HCG BLOOD, ED ([facility], [facility], [facility] ONLY)  GC/CHLAMYDIA PROBE AMP ([facility]) NOT AT [facility]    EKG None  Radiology US PELVIC COMPLETE W TRANSVAGINAL AND TORSION R/O  Result Date: 01/29/2021 CLINICAL DATA:  Pelvic pain. EXAM: TRANSABDOMINAL AND TRANSVAGINAL ULTRASOUND OF PELVIS DOPPLER ULTRASOUND OF OVARIES TECHNIQUE: Both transabdominal and transvaginal ultrasound examinations of the pelvis were performed. Transabdominal technique was performed for global imaging of the pelvis including uterus, ovaries, adnexal regions, and pelvic cul-de-sac. It was necessary to proceed with endovaginal exam following the transabdominal exam to visualize the endometrium and ovaries. Color and duplex Doppler ultrasound was utilized to evaluate blood flow to the ovaries. COMPARISON:  None. FINDINGS: Uterus Measurements: 5.9 x 3.9 x 3.0 cm = volume: 35 mL. No fibroids or other mass visualized. Endometrium  Thickness: 8 mm which is within normal limits. Intrauterine device is noted in endometrial space. Right ovary Measurements: 6.2 x 5.8 x 4.2 cm = volume: 79 mL. 4.4 cm complex abnormality is noted in the right ovary most consistent with hemorrhagic cyst or follicle. Left ovary Measurements: 3.3 x 2.4 x 2.4 cm = volume: 10 mL. Normal appearance/no adnexal mass. Pulsed Doppler evaluation of both ovaries demonstrates normal low-resistance arterial and venous waveforms. Other findings Small amount of free fluid is  noted which most likely is physiologic. IMPRESSION: Intrauterine device is noted. No evidence of ovarian torsion. 4.4 cm complex right ovarian abnormality is noted most consistent with hemorrhagic cyst or follicle. Short-interval follow up ultrasound in 6-12 weeks is recommended, preferably during the week following the patient's normal menses. Electronically Signed   By: Lupita Raider M.D.   On: 01/29/2021 13:57    Procedures Procedures   Medications Ordered in ED Medications  ketorolac (TORADOL) injection 60 mg (has no administration in time range)    ED Course  I have reviewed the triage vital signs and the nursing notes.  Pertinent labs & imaging results that were available during my care of the patient were reviewed by me and considered in my medical decision making (see chart for details).    MDM Rules/Calculators/A&P                           Patient given Toradol and feels better.  Has evidence of right ovarian cyst.  No concerns for STI.  Offered pelvic and she has deferred.  We will discharge her on NSAIDs Final Clinical Impression(s) / ED Diagnoses Final diagnoses:  Pain    Rx / DC Orders ED Discharge Orders     None        Lorre Nick, MD 01/29/21 1816

## 2021-01-30 LAB — RPR: RPR Ser Ql: NONREACTIVE

## 2021-02-09 ENCOUNTER — Ambulatory Visit: Admit: 2021-02-09 | Discharge: 2021-02-10 | Payer: TRICARE (CHAMPUS)

## 2021-02-09 DIAGNOSIS — M329 Systemic lupus erythematosus, unspecified: Principal | ICD-10-CM

## 2021-02-09 DIAGNOSIS — N83201 Unspecified ovarian cyst, right side: Principal | ICD-10-CM

## 2021-02-09 DIAGNOSIS — Z23 Encounter for immunization: Principal | ICD-10-CM

## 2021-02-09 DIAGNOSIS — Z1159 Encounter for screening for other viral diseases: Principal | ICD-10-CM

## 2021-02-09 DIAGNOSIS — F32A Depression, unspecified depression type: Principal | ICD-10-CM

## 2021-02-09 DIAGNOSIS — R21 Rash and other nonspecific skin eruption: Principal | ICD-10-CM

## 2021-02-28 DIAGNOSIS — M329 Systemic lupus erythematosus, unspecified: Principal | ICD-10-CM

## 2021-03-01 MED ORDER — PREDNISONE 1 MG TABLET
ORAL_TABLET | ORAL | 1 refills | 0.00000 days | Status: CP
Start: 2021-03-01 — End: ?

## 2021-03-08 DIAGNOSIS — N390 Urinary tract infection, site not specified: Principal | ICD-10-CM

## 2021-03-08 DIAGNOSIS — M329 Systemic lupus erythematosus, unspecified: Principal | ICD-10-CM

## 2021-03-09 DIAGNOSIS — N390 Urinary tract infection, site not specified: Principal | ICD-10-CM

## 2021-03-13 ENCOUNTER — Emergency Department (HOSPITAL_COMMUNITY)
Admission: EM | Admit: 2021-03-13 | Discharge: 2021-03-13 | Disposition: A | Discharge status: Home / Self Care | Attending: Emergency Medicine | Admitting: Emergency Medicine

## 2021-03-13 ENCOUNTER — Encounter (HOSPITAL_COMMUNITY): Payer: Self-pay | Admitting: Emergency Medicine

## 2021-03-13 ENCOUNTER — Emergency Department (HOSPITAL_COMMUNITY)

## 2021-03-13 ENCOUNTER — Other Ambulatory Visit: Payer: Self-pay

## 2021-03-13 DIAGNOSIS — D708 Other neutropenia: Secondary | ICD-10-CM | POA: Diagnosis not present

## 2021-03-13 DIAGNOSIS — R102 Pelvic and perineal pain: Secondary | ICD-10-CM | POA: Diagnosis not present

## 2021-03-13 DIAGNOSIS — N83201 Unspecified ovarian cyst, right side: Secondary | ICD-10-CM | POA: Diagnosis not present

## 2021-03-13 DIAGNOSIS — M549 Dorsalgia, unspecified: Secondary | ICD-10-CM | POA: Insufficient documentation

## 2021-03-13 DIAGNOSIS — R32 Unspecified urinary incontinence: Secondary | ICD-10-CM

## 2021-03-13 DIAGNOSIS — M359 Systemic involvement of connective tissue, unspecified: Secondary | ICD-10-CM

## 2021-03-13 DIAGNOSIS — Z20822 Contact with and (suspected) exposure to covid-19: Secondary | ICD-10-CM | POA: Insufficient documentation

## 2021-03-13 DIAGNOSIS — N393 Stress incontinence (female) (male): Secondary | ICD-10-CM | POA: Insufficient documentation

## 2021-03-13 LAB — CBC WITH DIFFERENTIAL/PLATELET
Abs Immature Granulocytes: 0 10*3/uL (ref 0.00–0.07)
Basophils Absolute: 0 10*3/uL (ref 0.0–0.1)
Basophils Relative: 1 %
Eosinophils Absolute: 0.3 10*3/uL (ref 0.0–0.5)
Eosinophils Relative: 12 %
HCT: 35.5 % — ABNORMAL LOW (ref 36.0–46.0)
Hemoglobin: 12.2 g/dL (ref 12.0–15.0)
Immature Granulocytes: 0 %
Lymphocytes Relative: 19 %
Lymphs Abs: 0.4 10*3/uL — ABNORMAL LOW (ref 0.7–4.0)
MCH: 32 pg (ref 26.0–34.0)
MCHC: 34.4 g/dL (ref 30.0–36.0)
MCV: 93.2 fL (ref 80.0–100.0)
Monocytes Absolute: 0.2 10*3/uL (ref 0.1–1.0)
Monocytes Relative: 10 %
Neutro Abs: 1.2 10*3/uL — ABNORMAL LOW (ref 1.7–7.7)
Neutrophils Relative %: 58 %
Platelets: 213 10*3/uL (ref 150–400)
RBC: 3.81 MIL/uL — ABNORMAL LOW (ref 3.87–5.11)
RDW: 11.4 % — ABNORMAL LOW (ref 11.5–15.5)
WBC: 2.1 10*3/uL — ABNORMAL LOW (ref 4.0–10.5)
nRBC: 0 % (ref 0.0–0.2)

## 2021-03-13 LAB — URINALYSIS, ROUTINE W REFLEX MICROSCOPIC
Bilirubin Urine: NEGATIVE
Glucose, UA: NEGATIVE mg/dL
Hgb urine dipstick: NEGATIVE
Ketones, ur: 5 mg/dL — AB
Leukocytes,Ua: NEGATIVE
Nitrite: NEGATIVE
Protein, ur: 30 mg/dL — AB
Specific Gravity, Urine: 1.02 (ref 1.005–1.030)
pH: 6 (ref 5.0–8.0)

## 2021-03-13 LAB — COMPREHENSIVE METABOLIC PANEL
ALT: 9 U/L (ref 0–44)
AST: 15 U/L (ref 15–41)
Albumin: 4.9 g/dL (ref 3.5–5.0)
Alkaline Phosphatase: 54 U/L (ref 38–126)
Anion gap: 10 (ref 5–15)
BUN: 9 mg/dL (ref 6–20)
CO2: 25 mmol/L (ref 22–32)
Calcium: 9.5 mg/dL (ref 8.9–10.3)
Chloride: 101 mmol/L (ref 98–111)
Creatinine, Ser: 0.65 mg/dL (ref 0.44–1.00)
GFR, Estimated: >60 mL/min (ref >60)
Glucose, Bld: 80 mg/dL (ref 70–99)
Potassium: 3.4 mmol/L — ABNORMAL LOW (ref 3.5–5.1)
Sodium: 136 mmol/L (ref 135–145)
Total Bilirubin: 1.1 mg/dL (ref 0.3–1.2)
Total Protein: 8.6 g/dL — ABNORMAL HIGH (ref 6.5–8.1)

## 2021-03-13 LAB — RESP PANEL BY RT-PCR (FLU A&B, COVID) ARPGX2
Influenza A by PCR: NEGATIVE
Influenza B by PCR: NEGATIVE
SARS Coronavirus 2 by RT PCR: NEGATIVE

## 2021-03-13 LAB — PREGNANCY, URINE: Preg Test, Ur: NEGATIVE

## 2021-03-13 NOTE — ED Provider Notes (Signed)
[facility] DEPT Provider Note   CSN: [id number] Arrival date & time: 03/13/21  [id number]     History Chief Complaint  Patient presents with   Hematuria    Tonya Gaines is a 25 y.o. female.  HPI    25 year old female with history of lupus comes in with chief complaint of hematuria. Patient reports that on 11-18 she was having some burning with urination.  She saw urgent care and was started on antibiotics.  Her pain with urination has cleared up, however she started noticing some blood in the urine.  Over the last 2 days she had seen some clots also come out, and she has been having some nausea, generalized weakness and lack of appetite, myalgias etc. she called her PCP and was advised to come to the ER.  Patient also reports about 2 weeks of incontinence.  She could be watching television and all of a sudden soil herself.  She denies any new back pain, but does indicate having intermittent back pain in general.  She also reports having some weakness in her legs, however she is able to ambulate without significant difficulty.  She denies any numbness and there is no bowel incontinence or urinary retention issues.  Patient denies any history of high risk sexual behavior, history of STDs.   Past Medical History:  Diagnosis Date   Lupus ([place])     There are no problems to display for this patient.   Past Surgical History:  Procedure Laterality Date   APPENDECTOMY       OB History   No obstetric history on file.     Family History  Problem Relation Age of Onset   Healthy Mother    Healthy Father     Social History   Tobacco Use   Smoking status: Never   Smokeless tobacco: Never  Vaping Use   Vaping Use: Never used  Substance Use Topics   Alcohol use: Yes   Drug use: Not Currently    Home Medications Prior to Admission medications   Medication Sig Start Date End Date Taking? Authorizing Provider  hydroxychloroquine (PLAQUENIL) 200  MG tablet Take 200 mg by mouth every evening. 11/06/20   [provider]  hydrOXYzine (ATARAX/VISTARIL) 25 MG tablet Take 1 tablet (25 mg total) by mouth 3 (three) times daily as needed for anxiety. Patient not taking: Reported on 01/29/2021 10/02/20   Leandro Reasoner A, NP  leucovorin (WELLCOVORIN) 5 MG tablet Take 10 mg by mouth once a week. 01/21/21   [provider]  methotrexate (RHEUMATREX) 2.5 MG tablet Take 15 mg by mouth once a week. 10/31/20   [provider]  predniSONE (DELTASONE) 1 MG tablet Take 1 mg by mouth daily with breakfast.    [provider]  predniSONE (DELTASONE) 5 MG tablet Take 5 mg by mouth every evening.    [provider]  sertraline (ZOLOFT) 100 MG tablet Take 100 mg by mouth at bedtime. 12/03/20   [provider]    Allergies    Amoxicillin and Penicillin g  Review of Systems   Review of Systems  Constitutional:  Positive for activity change and fatigue.  Respiratory:  Negative for shortness of breath.   Cardiovascular:  Negative for chest pain.  Genitourinary:  Positive for hematuria.  Allergic/Immunologic: Positive for immunocompromised state.  Hematological:  Does not bruise/bleed easily.  All other systems reviewed and are negative.  Physical Exam Updated Vital Signs BP 106/81   Pulse 85  Temp 98.4 F (36.9 C) (Oral)   Resp 16   Ht 5\' 4"  (1.626 m)   Wt 48.5 kg   SpO2 100%   BMI 18.37 kg/m   Physical Exam Vitals and nursing note reviewed.  Constitutional:      Appearance: She is well-developed.  HENT:     Head: Atraumatic.  Eyes:     Extraocular Movements: Extraocular movements intact.     Pupils: Pupils are equal, round, and reactive to light.  Cardiovascular:     Rate and Rhythm: Normal rate.  Pulmonary:     Effort: Pulmonary effort is normal.  Musculoskeletal:     Cervical back: Normal range of motion and neck supple.     Comments: No midline lumbar spine tenderness  Skin:     General: Skin is warm and dry.  Neurological:     Mental Status: She is alert and oriented to person, place, and time.     Cranial Nerves: No cranial nerve deficit.     Sensory: No sensory deficit.     Motor: No weakness.     Coordination: Coordination normal.     Gait: Gait normal.    ED Results / Procedures / Treatments   Labs (all labs ordered are listed, but only abnormal results are displayed) Labs Reviewed  CBC WITH DIFFERENTIAL/PLATELET - Abnormal; Notable for the following components:      Result Value   WBC 2.1 (*)    RBC 3.81 (*)    HCT 35.5 (*)    RDW 11.4 (*)    Neutro Abs 1.2 (*)    Lymphs Abs 0.4 (*)    All other components within normal limits  COMPREHENSIVE METABOLIC PANEL - Abnormal; Notable for the following components:   Potassium 3.4 (*)    Total Protein 8.6 (*)    All other components within normal limits  URINALYSIS, ROUTINE W REFLEX MICROSCOPIC - Abnormal; Notable for the following components:   APPearance HAZY (*)    Ketones, ur 5 (*)    Protein, ur 30 (*)    Bacteria, UA RARE (*)    All other components within normal limits  RESP PANEL BY RT-PCR (FLU A&B, COVID) ARPGX2  URINE CULTURE  PREGNANCY, URINE    EKG None  Radiology US Transvaginal Non-OB  Result Date: 03/13/2021 CLINICAL DATA:  Pelvic pain and bleeding. EXAM: TRANSABDOMINAL AND TRANSVAGINAL ULTRASOUND OF PELVIS DOPPLER ULTRASOUND OF OVARIES TECHNIQUE: Both transabdominal and transvaginal ultrasound examinations of the pelvis were performed. Transabdominal technique was performed for global imaging of the pelvis including uterus, ovaries, adnexal regions, and pelvic cul-de-sac. It was necessary to proceed with endovaginal exam following the transabdominal exam to visualize the endometrium and adnexa. Color and duplex Doppler ultrasound was utilized to evaluate blood flow to the ovaries. COMPARISON:  Ultrasound of the pelvis January 29, 2021 FINDINGS: Uterus Measurements: 5.9 x 2.7 x 3.5 cm  = volume: 29.2 mL. No fibroids or other mass visualized. Endometrium Thickness: Approximately 6 mm. Poorly visualized due to presence of IUD. Right ovary Measurements: 4.3 x 2.7 x 3.1 cm = volume: 18.7 mL. There is an isoechoic mass on the right ovary measuring 2.3 x 2.1 x 1.8 cm. Previously it measured 4.4 x 3.4 x 3.9 cm. Left ovary Measurements: 3.2 x 1.6 x 2.5 cm = volume: 6.4 mL. Normal appearance/no adnexal mass. Pulsed Doppler evaluation of both ovaries demonstrates normal low-resistance arterial and venous waveforms. Other findings No abnormal free fluid. IMPRESSION: Normal appearance of the uterus with IUD  in place. Normal appearance of the left ovary. The right ovary is enlarged and contains a 2.3 cm isoechoic mass. In the absence of increased beta HCG, the differential diagnosis includes hemorrhagic cyst, degenerating corpus luteum or less likely solid adnexal mass. It has mildly decreased in size from the prior ultrasound. Electronically Signed   By: Fidela Salisbury M.D.   On: 03/13/2021 12:55   US Pelvis Complete  Result Date: 03/13/2021 CLINICAL DATA:  Pelvic pain and bleeding. EXAM: TRANSABDOMINAL AND TRANSVAGINAL ULTRASOUND OF PELVIS DOPPLER ULTRASOUND OF OVARIES TECHNIQUE: Both transabdominal and transvaginal ultrasound examinations of the pelvis were performed. Transabdominal technique was performed for global imaging of the pelvis including uterus, ovaries, adnexal regions, and pelvic cul-de-sac. It was necessary to proceed with endovaginal exam following the transabdominal exam to visualize the endometrium and adnexa. Color and duplex Doppler ultrasound was utilized to evaluate blood flow to the ovaries. COMPARISON:  Ultrasound of the pelvis January 29, 2021 FINDINGS: Uterus Measurements: 5.9 x 2.7 x 3.5 cm = volume: 29.2 mL. No fibroids or other mass visualized. Endometrium Thickness: Approximately 6 mm. Poorly visualized due to presence of IUD. Right ovary Measurements: 4.3 x 2.7 x 3.1  cm = volume: 18.7 mL. There is an isoechoic mass on the right ovary measuring 2.3 x 2.1 x 1.8 cm. Previously it measured 4.4 x 3.4 x 3.9 cm. Left ovary Measurements: 3.2 x 1.6 x 2.5 cm = volume: 6.4 mL. Normal appearance/no adnexal mass. Pulsed Doppler evaluation of both ovaries demonstrates normal low-resistance arterial and venous waveforms. Other findings No abnormal free fluid. IMPRESSION: Normal appearance of the uterus with IUD in place. Normal appearance of the left ovary. The right ovary is enlarged and contains a 2.3 cm isoechoic mass. In the absence of increased beta HCG, the differential diagnosis includes hemorrhagic cyst, degenerating corpus luteum or less likely solid adnexal mass. It has mildly decreased in size from the prior ultrasound. Electronically Signed   By: Fidela Salisbury M.D.   On: 03/13/2021 12:55   [place] Art/Ven Flow Abd Pelv Doppler  Result Date: 03/13/2021 CLINICAL DATA:  Pelvic pain and bleeding. EXAM: TRANSABDOMINAL AND TRANSVAGINAL ULTRASOUND OF PELVIS DOPPLER ULTRASOUND OF OVARIES TECHNIQUE: Both transabdominal and transvaginal ultrasound examinations of the pelvis were performed. Transabdominal technique was performed for global imaging of the pelvis including uterus, ovaries, adnexal regions, and pelvic cul-de-sac. It was necessary to proceed with endovaginal exam following the transabdominal exam to visualize the endometrium and adnexa. Color and duplex Doppler ultrasound was utilized to evaluate blood flow to the ovaries. COMPARISON:  Ultrasound of the pelvis January 29, 2021 FINDINGS: Uterus Measurements: 5.9 x 2.7 x 3.5 cm = volume: 29.2 mL. No fibroids or other mass visualized. Endometrium Thickness: Approximately 6 mm. Poorly visualized due to presence of IUD. Right ovary Measurements: 4.3 x 2.7 x 3.1 cm = volume: 18.7 mL. There is an isoechoic mass on the right ovary measuring 2.3 x 2.1 x 1.8 cm. Previously it measured 4.4 x 3.4 x 3.9 cm. Left ovary Measurements: 3.2 x  1.6 x 2.5 cm = volume: 6.4 mL. Normal appearance/no adnexal mass. Pulsed Doppler evaluation of both ovaries demonstrates normal low-resistance arterial and venous waveforms. Other findings No abnormal free fluid. IMPRESSION: Normal appearance of the uterus with IUD in place. Normal appearance of the left ovary. The right ovary is enlarged and contains a 2.3 cm isoechoic mass. In the absence of increased beta HCG, the differential diagnosis includes hemorrhagic cyst, degenerating corpus luteum or less likely solid adnexal  mass. It has mildly decreased in size from the prior ultrasound. Electronically Signed   By: Ted Mcalpine M.D.   On: 03/13/2021 12:55    Procedures Procedures   Medications Ordered in ED Medications - No data to display  ED Course  I have reviewed the triage vital signs and the nursing notes.  Pertinent labs & imaging results that were available during my care of the patient were reviewed by me and considered in my medical decision making (see chart for details).    MDM Rules/Calculators/A&P                            25 year old female comes in with multiple complaints.  Primarily she is complaining of blood in the urine.  She had hematuria a few days back for which she was given antibiotics.  Dysuria was associated with it, which is cleared.  Hematuria has persisted.  She denies any frank vaginal bleeding.  UA is completely normal at this time.  She had an ultrasound in October for pelvic pain and an ultrasound for today's visit, it reveals right-sided ovarian cyst.  Suspect that most likely patient had a hemorrhagic cyst.  She has a follow-up coming up with a gynecologist later this month, I think she will benefit from that visit to get clarification on the exact morphology of the adnexal lesion.  Does not need any antibiotics at this time clinically.  Ignore culture results that was sent by triage in case they are positive.  Patient also reports incontinence.  Unsure  what to make of it.  Does not appear to be stress or urge incontinence.  Does not appear to be overflow incontinence.  Bladder scan was ordered and it is not revealing any postvoid residual.  Screening for spinal cord compression or pathology is also negative.  Patient has intact upper extremity and lower extremity strength able to ambulate without significant difficulty.  She has no gross sensory issues.  I did discuss the case with urology team.  They have an incontinence clinic in which they can see the patient.  Patient can be sent to a neurologist if the work-up is reassuring from urologic perspective.  I do not think an emergent MRI is indicated.  Return precautions discussed, in case patient starts developing new symptoms worsening weakness in her legs or numbness in her legs.  Finally, patient was noted to be neutropenic both at this visit and the last visit.  I suspect that it is because of her lupus.  She has been advised to get a follow-up for it.    Final Clinical Impression(s) / ED Diagnoses Final diagnoses:  Neutropenia associated with autoimmune disease (HCC)  Cyst of right ovary  Incontinence in female    Rx / DC Orders ED Discharge Orders     None        Derwood Kaplan, MD 03/13/21 1437

## 2021-03-13 NOTE — ED Notes (Signed)
Pt. Was called x3 for reassess vitals with no response. Nurse aware.

## 2021-03-13 NOTE — ED Provider Notes (Signed)
Emergency Medicine Provider Triage Evaluation Note  Tonya Gaines , a 25 y.o. female  was evaluated in triage.  Pt complains of suprapubic abdominal pain with hematuria with some urinary incontinence for the past 6 days. The patient was recently diagnosed with a UTI on 11/18 and noticed the blood in her urine after completing her course of antibiotics. She reports the incontinence is occasionally and is more than a few drops, but not enough to soak her underwear and she does not feel it coming out. I questioned if it was vaginal discharge and the patient is not sure. She currently does not have cycles d/t an IUD. The patient has lupus and is on allopurinol and methotrexate  Review of Systems  Positive: Abdominal pain, hematuria, incontinence (?) Negative: Fevers, diarrhea, constipation, dysuria, urinary urgency/frequency  Physical Exam  BP 108/79 (BP Location: Left Arm)   Pulse (!) 108   Temp 98.4 F (36.9 C) (Oral)   Resp 16   Ht 5\' 4"  (1.626 m)   Wt 48.5 kg   SpO2 98%   BMI 18.37 kg/m  Gen:   Awake, no distress   Resp:  Normal effort  MSK:   Moves extremities without difficulty  Other:  Abdomen soft and non distended. Mild tenderness to palpation of the lower abdomen.   Medical Decision Making  Medically screening exam initiated at 10:40 AM.  Appropriate orders placed.  Tonya Gaines was informed that the remainder of the evaluation will be completed by another provider, this initial triage assessment does not replace that evaluation, and the importance of remaining in the ED until their evaluation is complete.  Labs and ultrasound ordered   , [name] 03/13/21 1044    14/03/22, MD 03/18/21 [phone]

## 2021-03-13 NOTE — ED Notes (Signed)
Post void residual measured with bladder scan noted at 0 ml

## 2021-03-13 NOTE — ED Triage Notes (Signed)
Patient reports being diagnosed with uti on 02/26/21, finished antibiotics, now has blood in urine and slight incontinence. Patient says she has bladder pain that comes and goes, rated 7/10

## 2021-03-13 NOTE — Discharge Instructions (Addendum)
The work-up in the ER reveals persistent right ovarian mass.  We suspect that it is an ovarian cyst, which could have ruptured and caused bleeding.  The follow-up with the gynecologist is important to get to the appropriate diagnosis and surveillance.  Return to the ER if you start having worsening bleeding.  As discussed, you have evidence of neutropenia in her blood work.  We suspect that it is because of lupus or the medication that you take for it.  Read the instructions provided on neutropenia.  Have your primary care doctor recheck your blood work in 4 to 6 weeks.  Finally, you mentioned having incontinence over the past few weeks.  If the symptoms continue, call the urologist at the number provided and ask specifically for you to be seen in the incontinence clinic.  If you start having weakness in your legs, numbness in your legs return to the ER immediately.

## 2021-03-15 LAB — URINE CULTURE: Culture: 60000 — AB

## 2021-03-16 ENCOUNTER — Telehealth: Payer: Self-pay | Admitting: Emergency Medicine

## 2021-03-16 NOTE — Telephone Encounter (Signed)
Post ED Visit - Positive Culture Follow-up  Culture report reviewed by antimicrobial stewardship pharmacist: [facility] []  , Pharm.D. []  Enzo Bi, Pharm.D., BCPS AQ-ID []  , Pharm.D., BCPS []  Celedonio Miyamoto, Pharm.D., BCPS []  [place], Garvin Fila.D., BCPS, AAHIVP []  , Pharm.D., BCPS, AAHIVP []  Georgina Pillion, PharmD, BCPS []  , PharmD, BCPS []  [place], PharmD, BCPS []  [place], PharmD []  , PharmD, BCPS []  Estella Husk, PharmD  Pharmacy Team []  Lysle Pearl, PharmD []  , PharmD []  Phillips Climes, PharmD []  , Rph []  Agapito Games) , PharmD []  Verlan Friends, PharmD []  , PharmD []  Mervyn Gay, PharmD []  , PharmD []  Vinnie Level, PharmD []  [facility], PharmD []  , PharmD []  Len Childs, PharmD  PharmD   Positive urineculture Insignificant growth and already treated with antibiotics, no further patient follow-up is required at this time.  Greer Pickerel 03/16/2021, 9:54 AM

## 2021-05-13 DIAGNOSIS — M329 Systemic lupus erythematosus, unspecified: Principal | ICD-10-CM

## 2021-05-13 MED ORDER — HYDROXYCHLOROQUINE 200 MG TABLET
ORAL_TABLET | 3 refills | 0 days | Status: CP
Start: 2021-05-13 — End: ?

## 2021-05-24 MED ORDER — METHOTREXATE SODIUM 2.5 MG TABLET
ORAL_TABLET | 3 refills | 0 days | Status: CP
Start: 2021-05-24 — End: ?

## 2021-06-03 ENCOUNTER — Ambulatory Visit
Admit: 2021-06-03 | Discharge: 2021-06-04 | Payer: TRICARE (CHAMPUS) | Attending: Internal Medicine | Primary: Internal Medicine

## 2021-06-03 DIAGNOSIS — R634 Abnormal weight loss: Principal | ICD-10-CM

## 2021-06-03 DIAGNOSIS — Z79631 Methotrexate, long term, current use: Principal | ICD-10-CM

## 2021-06-03 DIAGNOSIS — M329 Systemic lupus erythematosus, unspecified: Principal | ICD-10-CM

## 2021-06-14 MED ORDER — AZATHIOPRINE 50 MG TABLET
ORAL_TABLET | Freq: Every day | ORAL | 11 refills | 30 days | Status: CP
Start: 2021-06-14 — End: 2022-06-14

## 2021-07-22 ENCOUNTER — Ambulatory Visit: Admit: 2021-07-22 | Discharge: 2021-07-23 | Payer: TRICARE (CHAMPUS) | Attending: Family | Primary: Family

## 2021-07-22 DIAGNOSIS — M329 Systemic lupus erythematosus, unspecified: Principal | ICD-10-CM

## 2021-07-22 MED ORDER — PREDNISONE 5 MG TABLET
ORAL_TABLET | ORAL | 0 refills | 12 days | Status: CP
Start: 2021-07-22 — End: 2021-08-03

## 2021-07-26 DIAGNOSIS — M329 Systemic lupus erythematosus, unspecified: Principal | ICD-10-CM

## 2021-07-26 DIAGNOSIS — Z119 Encounter for screening for infectious and parasitic diseases, unspecified: Principal | ICD-10-CM

## 2021-07-27 ENCOUNTER — Ambulatory Visit: Admit: 2021-07-27 | Discharge: 2021-07-28 | Payer: TRICARE (CHAMPUS)

## 2021-07-27 DIAGNOSIS — Z119 Encounter for screening for infectious and parasitic diseases, unspecified: Principal | ICD-10-CM

## 2021-08-18 DIAGNOSIS — M329 Systemic lupus erythematosus, unspecified: Principal | ICD-10-CM

## 2021-09-03 DIAGNOSIS — U071 COVID: Principal | ICD-10-CM

## 2021-09-03 MED ORDER — PAXLOVID 300 MG (150 MG X 2)-100 MG TABLETS IN A DOSE PACK (EUA)
ORAL_TABLET | 0 refills | 0 days | Status: CP
Start: 2021-09-03 — End: ?

## 2021-09-15 DIAGNOSIS — M329 Systemic lupus erythematosus, unspecified: Principal | ICD-10-CM

## 2021-10-12 DIAGNOSIS — M329 Systemic lupus erythematosus, unspecified: Principal | ICD-10-CM

## 2021-11-10 DIAGNOSIS — M329 Systemic lupus erythematosus, unspecified: Principal | ICD-10-CM

## 2021-11-18 IMAGING — CT CT ANGIO CHEST
2 of 6 series · 18 of 36 positions shown · IV contrast (OMNIPAQUE 350)
Comparison: Chest from earlier in the same day.

CLINICAL DATA: Chest pain and pressure

EXAM:
CT ANGIOGRAPHY CHEST WITH CONTRAST
TECHNIQUE: Multidetector CT imaging of the chest was performed using the
standard protocol during bolus administration of intravenous
contrast. Multiplanar CT image reconstructions and MIPs were
obtained to evaluate the vascular anatomy.
CONTRAST:  80mL OMNIPAQUE IOHEXOL 350 MG/ML SOLN

[Series 5: thins · axial · 0.67mm/px · z∈[+1162,+1404]mm · 17 of 274 slices shown]
[im 16/274  lung]
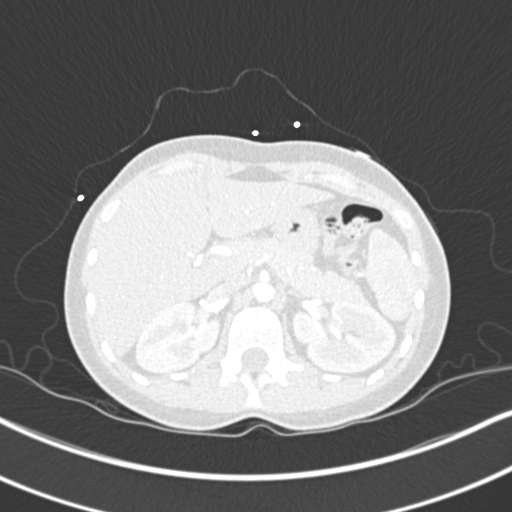
[im 31/274  mediastinal]
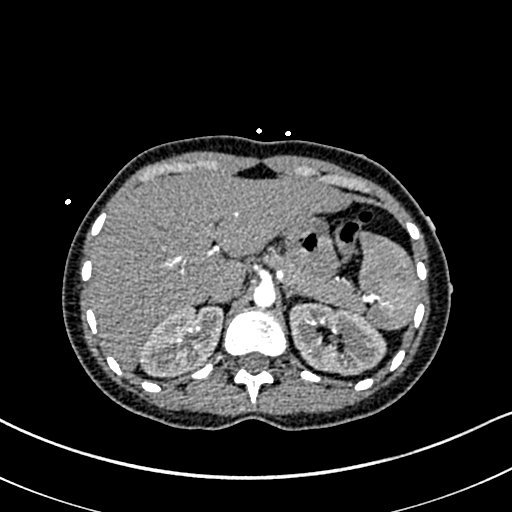
[im 46/274  lung]
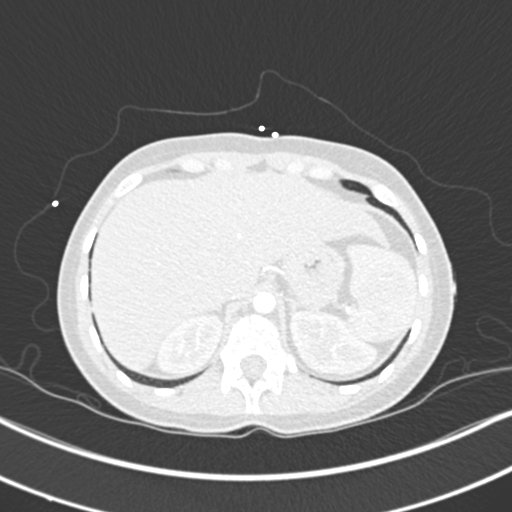
[im 61/274  mediastinal]
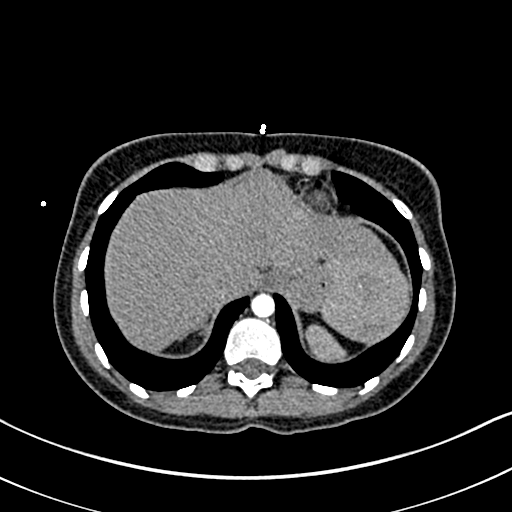
[im 76/274  lung]
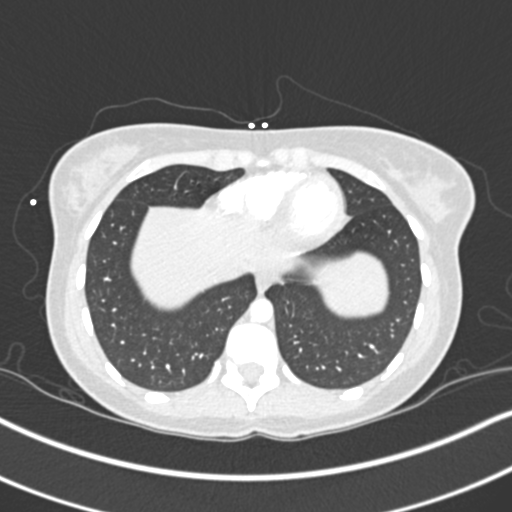
[im 92/274  mediastinal]
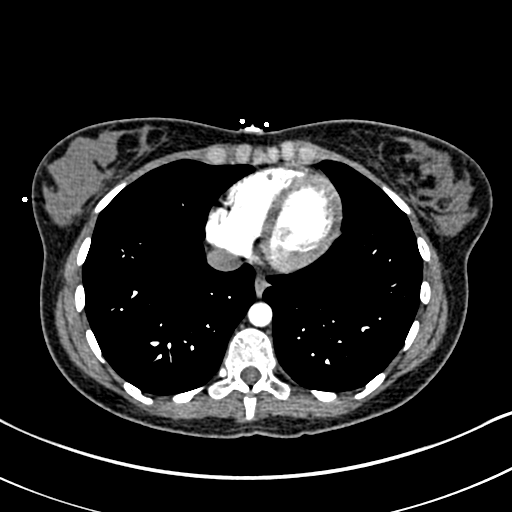
[im 107/274  lung]
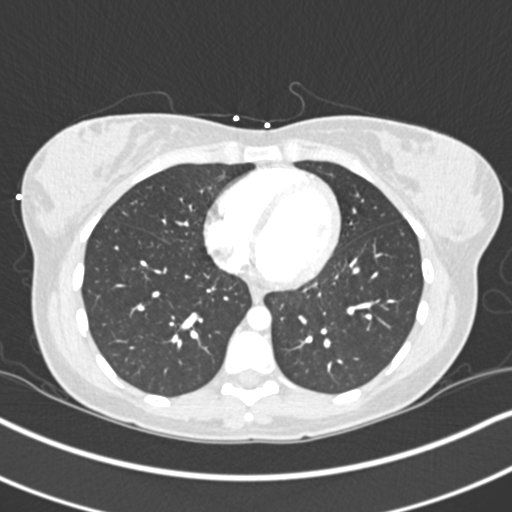
[im 122/274  mediastinal]
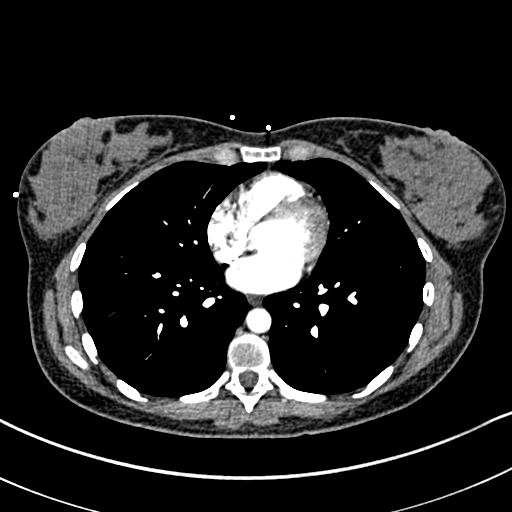
[im 137/274  lung]
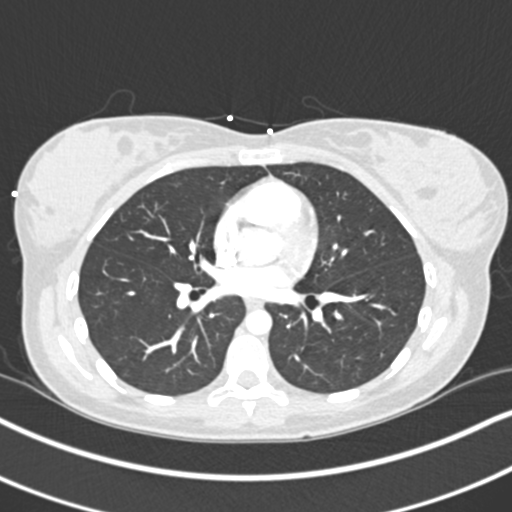
[im 152/274  mediastinal]
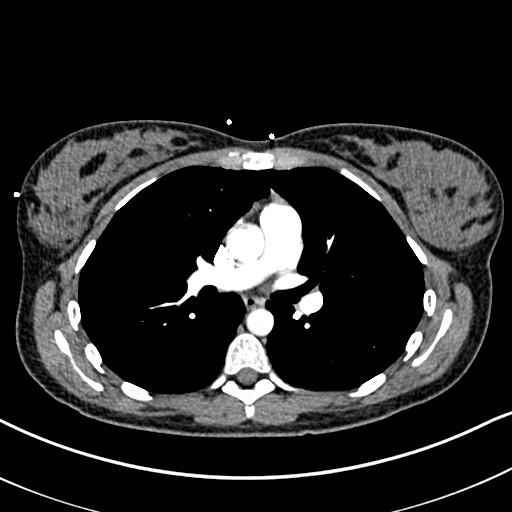
[im 167/274  lung]
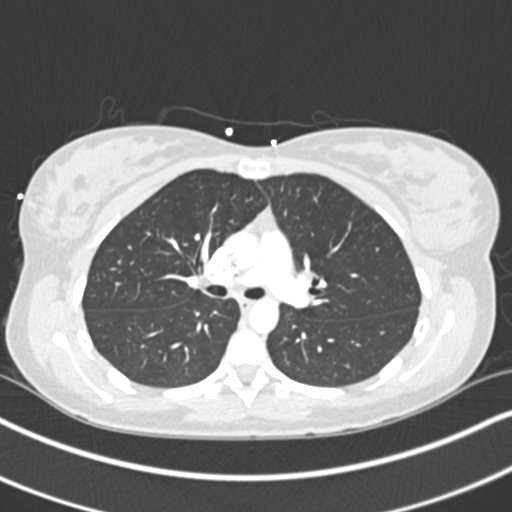
[im 183/274  mediastinal]
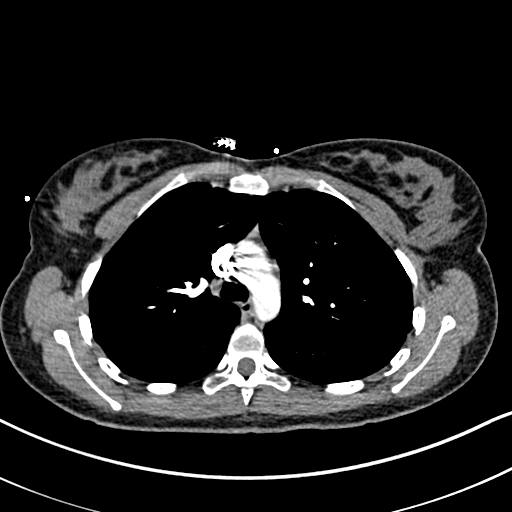
[im 198/274  lung]
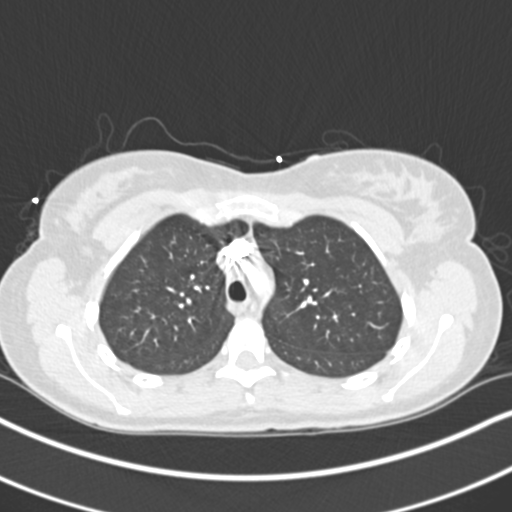
[im 213/274  mediastinal]
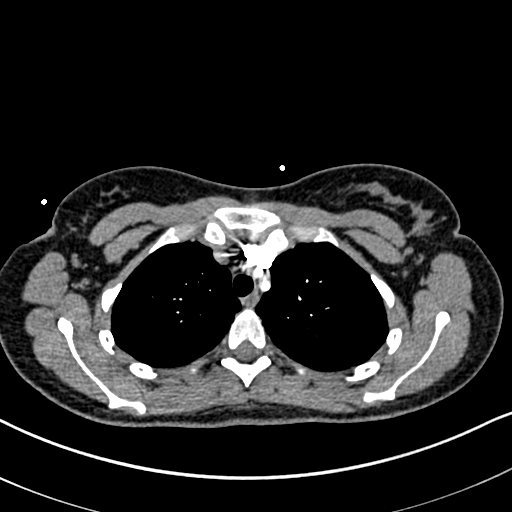
[im 228/274  lung]
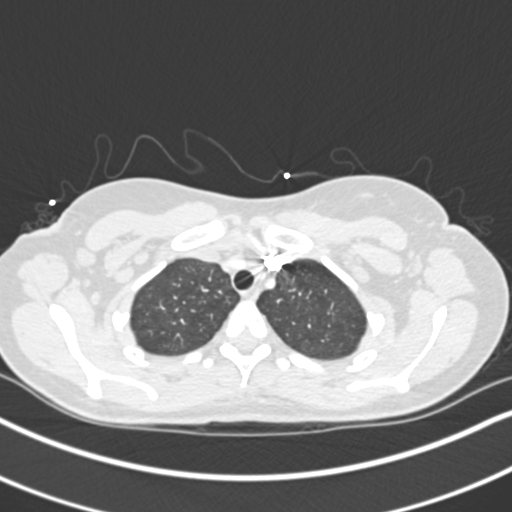
[im 243/274  mediastinal]
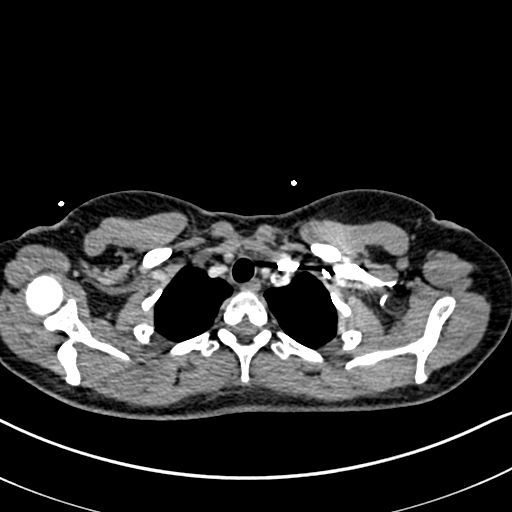
[im 258/274  lung]
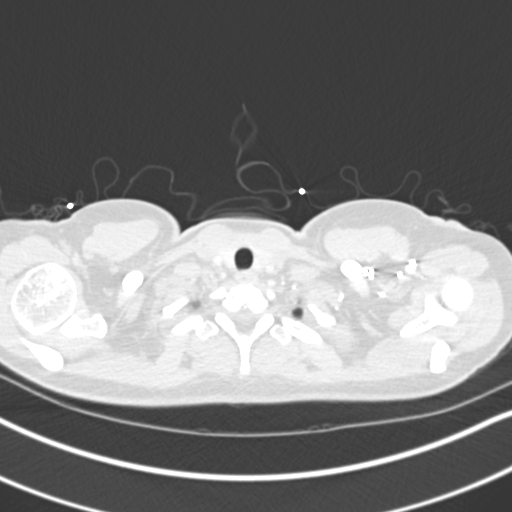

[Series 7: coronal mpr · coronal · 0.59mm/px · 1 of 111 slices shown]
[im 56/111  mediastinal]
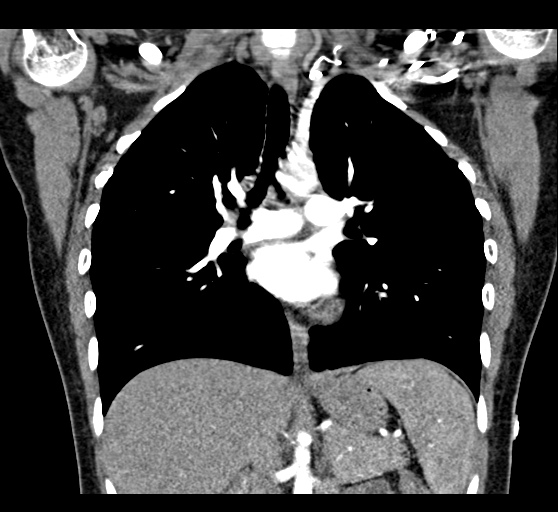

[18 of 36 positions shown; findings below may reference images not displayed]

FINDINGS: Cardiovascular: Atherosclerotic calcifications are noted in the
thoracic aorta and its branches. No aneurysmal dilatation or
dissection is noted. No cardiac enlargement is seen. The pulmonary
artery is well visualized within normal branching pattern
bilaterally. No intraluminal filling defect to suggest pulmonary
embolism is noted. No coronary calcifications are seen.

Mediastinum/Nodes: Thoracic inlet is within normal limits. No
sizable hilar or mediastinal adenopathy is noted. The esophagus is
within normal limits.

Lungs/Pleura: Lungs are well aerated bilaterally. No focal
infiltrate or sizable effusion is seen. No parenchymal nodules are
noted.

Upper Abdomen: Visualized upper abdomen is within normal limits.

Musculoskeletal: No chest wall abnormality. No acute or significant
osseous findings.

Review of the MIP images confirms the above findings.
IMPRESSION: No evidence of pulmonary emboli.

No acute abnormality noted.

Aortic Atherosclerosis (J818V-5TE.E).

## 2021-12-01 ENCOUNTER — Ambulatory Visit
Admit: 2021-12-01 | Discharge: 2021-12-01 | Payer: TRICARE (CHAMPUS) | Attending: Internal Medicine | Primary: Internal Medicine

## 2021-12-01 ENCOUNTER — Ambulatory Visit: Admit: 2021-12-01 | Discharge: 2021-12-01 | Payer: TRICARE (CHAMPUS)

## 2021-12-01 DIAGNOSIS — M25562 Pain in left knee: Principal | ICD-10-CM

## 2021-12-01 DIAGNOSIS — M329 Systemic lupus erythematosus, unspecified: Principal | ICD-10-CM

## 2021-12-01 DIAGNOSIS — M25561 Pain in right knee: Principal | ICD-10-CM

## 2021-12-19 DIAGNOSIS — M329 Systemic lupus erythematosus, unspecified: Principal | ICD-10-CM

## 2021-12-27 ENCOUNTER — Encounter (HOSPITAL_COMMUNITY): Payer: Self-pay

## 2021-12-27 ENCOUNTER — Emergency Department (HOSPITAL_COMMUNITY)
Admission: EM | Admit: 2021-12-27 | Discharge: 2021-12-27 | Disposition: A | Discharge status: Home / Self Care | Attending: Emergency Medicine | Admitting: Emergency Medicine

## 2021-12-27 ENCOUNTER — Other Ambulatory Visit: Payer: Self-pay

## 2021-12-27 DIAGNOSIS — K2970 Gastritis, unspecified, without bleeding: Secondary | ICD-10-CM | POA: Diagnosis not present

## 2021-12-27 DIAGNOSIS — R079 Chest pain, unspecified: Secondary | ICD-10-CM

## 2021-12-27 DIAGNOSIS — K2990 Gastroduodenitis, unspecified, without bleeding: Secondary | ICD-10-CM | POA: Insufficient documentation

## 2021-12-27 MED ORDER — FAMOTIDINE 20 MG PO TABS: 20.0000 mg | ORAL_TABLET | Freq: Two times a day (BID) | ORAL | 0 refills | Status: DC

## 2021-12-27 NOTE — ED Triage Notes (Signed)
Pt reports epigastric and substernal cp since this am.  Hx lupus

## 2021-12-27 NOTE — Discharge Instructions (Addendum)
Please follow-up with your primary care doctor's office for this visit to the ER today

## 2021-12-27 NOTE — ED Provider Notes (Signed)
[facility]-EMERGENCY DEPT Provider Note   CSN: [id number] Arrival date & time: 12/27/21  1314     History  Chief Complaint  Patient presents with   Chest Pain    Tonya Gaines is a 26 y.o. female presenting to the ED with burning chest discomfort that began when she woke up this morning.  She says it has been persistent although getting somewhat easier throughout the day.  She denies feeling this before.  Does not radiate anywhere else.  She denies history of reflux.  She does have lupus but denies association with chest pain.  She denies cough, shortness of breath.  Denies lightheadedness or fevers.  HPI     Home Medications Prior to Admission medications   Medication Sig Start Date End Date Taking? Authorizing Provider  famotidine (PEPCID) 20 MG tablet Take 1 tablet (20 mg total) by mouth 2 (two) times daily. Take 30 minutes before breakfast and dinner 12/27/21 01/26/22 Yes Thurmond Hildebran, Kermit Balo, MD  hydroxychloroquine (PLAQUENIL) 200 MG tablet Take 200 mg by mouth every evening. 11/06/20   [provider]  hydrOXYzine (ATARAX/VISTARIL) 25 MG tablet Take 1 tablet (25 mg total) by mouth 3 (three) times daily as needed for anxiety. Patient not taking: Reported on 01/29/2021 10/02/20   Cecilio Asper A, NP  leucovorin (WELLCOVORIN) 5 MG tablet Take 10 mg by mouth once a week. 01/21/21   [provider]  methotrexate (RHEUMATREX) 2.5 MG tablet Take 15 mg by mouth once a week. 10/31/20   [provider]  predniSONE (DELTASONE) 1 MG tablet Take 1 mg by mouth daily with breakfast.    [provider]  predniSONE (DELTASONE) 5 MG tablet Take 5 mg by mouth every evening.    [provider]  sertraline (ZOLOFT) 100 MG tablet Take 100 mg by mouth at bedtime. 12/03/20   [provider]      Allergies    Amoxicillin, Penicillin g, Sertraline, and Penicillins    Review of Systems   Review of Systems  Physical Exam Updated  Vital Signs BP 103/68   Pulse 89   Temp 99 F (37.2 C) (Oral)   Resp 16   SpO2 99%  Physical Exam Constitutional:      General: She is not in acute distress. HENT:     Head: Normocephalic and atraumatic.  Eyes:     Conjunctiva/sclera: Conjunctivae normal.     Pupils: Pupils are equal, round, and reactive to light.  Cardiovascular:     Rate and Rhythm: Normal rate and regular rhythm.  Pulmonary:     Effort: Pulmonary effort is normal. No respiratory distress.  Abdominal:     General: There is no distension.     Tenderness: There is abdominal tenderness in the epigastric area.  Skin:    General: Skin is warm and dry.  Neurological:     General: No focal deficit present.     Mental Status: She is alert. Mental status is at baseline.  Psychiatric:        Mood and Affect: Mood normal.        Behavior: Behavior normal.     ED Results / Procedures / Treatments   Labs (all labs ordered are listed, but only abnormal results are displayed) Labs Reviewed - No data to display  EKG EKG Interpretation  Date/Time:  Monday December 27 2021 13:27:09 EDT Ventricular Rate:  105 PR Interval:  130 QRS Duration: 96 QT Interval:  352 QTC Calculation: 465 R Axis:  62 Text Interpretation: Sinus tachycardia Otherwise normal ECG No previous ECGs available Confirmed by Octaviano Glow [phone]) on 12/27/2021 4:52:31 PM  Radiology No results found.  Procedures Procedures    Medications Ordered in ED Medications - No data to display  ED Course/ Medical Decision Making/ A&P                           Medical Decision Making  Substernal chest pain, epigastric pain  This is highly consistent reflux gastritis, given that it began while she was laying down in bed at night.  The very low suspicion for ACS, PE, pneumonia, pneumothorax.  Her lungs are clear to auscultation.  No wheezing.  Low suspicion for acute biliary disease or pancreatitis.  She is extremely well-appearing.  I  recommended famotidine twice a day, provided information about reflux.  I personally reviewed & interpreted her ECG which shows a normal sinus rhythm no acute ischemic findings        Final Clinical Impression(s) / ED Diagnoses Final diagnoses:  Chest pain, unspecified type  Gastritis and gastroduodenitis    Rx / DC Orders ED Discharge Orders          Ordered    famotidine (PEPCID) 20 MG tablet  2 times daily        12/27/21 1701              Wyvonnia Dusky, MD 12/27/21 1701

## 2022-01-05 ENCOUNTER — Other Ambulatory Visit: Payer: Self-pay

## 2022-01-05 ENCOUNTER — Encounter (HOSPITAL_COMMUNITY): Payer: Self-pay

## 2022-01-05 ENCOUNTER — Emergency Department (HOSPITAL_COMMUNITY)
Admission: EM | Admit: 2022-01-05 | Discharge: 2022-01-05 | Disposition: A | Discharge status: Home / Self Care | Attending: Emergency Medicine | Admitting: Emergency Medicine

## 2022-01-05 DIAGNOSIS — N898 Other specified noninflammatory disorders of vagina: Secondary | ICD-10-CM | POA: Insufficient documentation

## 2022-01-05 DIAGNOSIS — L988 Other specified disorders of the skin and subcutaneous tissue: Secondary | ICD-10-CM | POA: Insufficient documentation

## 2022-01-05 LAB — URINALYSIS, ROUTINE W REFLEX MICROSCOPIC
Bilirubin Urine: NEGATIVE
Glucose, UA: NEGATIVE mg/dL
Hgb urine dipstick: NEGATIVE
Ketones, ur: 5 mg/dL — AB
Leukocytes,Ua: NEGATIVE
Nitrite: NEGATIVE
Protein, ur: NEGATIVE mg/dL
Specific Gravity, Urine: 1.023 (ref 1.005–1.030)
pH: 5 (ref 5.0–8.0)

## 2022-01-05 LAB — WET PREP, GENITAL
Clue Cells Wet Prep HPF POC: NONE SEEN
Sperm: NONE SEEN
Trich, Wet Prep: NONE SEEN
WBC, Wet Prep HPF POC: <10 (ref <10)
Yeast Wet Prep HPF POC: NONE SEEN

## 2022-01-05 LAB — POC URINE PREG, ED: Preg Test, Ur: NEGATIVE

## 2022-01-05 MED ORDER — FLUCONAZOLE 150 MG PO TABS
150.0000 mg | ORAL_TABLET | Freq: Once | ORAL | Status: AC
  Administered 2022-01-05: 150 mg via ORAL
  Filled 2022-01-05: qty 1

## 2022-01-05 MED ORDER — CEFTRIAXONE SODIUM 1 G IJ SOLR
1.0000 g | Freq: Once | INTRAMUSCULAR | Status: AC
  Administered 2022-01-05: 1 g via INTRAMUSCULAR
  Filled 2022-01-05: qty 10

## 2022-01-05 MED ORDER — DOXYCYCLINE HYCLATE 100 MG PO TABS
100.0000 mg | ORAL_TABLET | Freq: Once | ORAL | Status: AC
  Administered 2022-01-05: 100 mg via ORAL
  Filled 2022-01-05: qty 1

## 2022-01-05 MED ORDER — LIDOCAINE HCL 2 % IJ SOLN
INTRAMUSCULAR | Status: AC
  Administered 2022-01-05: 2.1 mL
  Filled 2022-01-05: qty 20

## 2022-01-05 MED ORDER — DOXYCYCLINE HYCLATE 100 MG PO CAPS: 100.0000 mg | ORAL_CAPSULE | Freq: Two times a day (BID) | ORAL | 0 refills | Status: AC

## 2022-01-05 NOTE — ED Triage Notes (Signed)
Pt states that she has a split from her anus up to her butt crack. Pt does not know how it got there.

## 2022-01-05 NOTE — ED Provider Notes (Signed)
[facility]-EMERGENCY DEPT Provider Note   CSN: [id number] Arrival date & time: 01/05/22  2297     History {Add pertinent medical, surgical, social history, OB history to HPI:1} Chief Complaint  Patient presents with  . Laceration    Thresia Oliff is a 26 y.o. female, history of lupus, who presents to the ED secondary to a split from her anus to her butt crack that has been going on for the last week.  She states that she knows she is also having some vaginal discharge that is thin and white.  Reports being treated for UTI recently.  States she is unsure if she has an STD or if something else is going on, but notes that the vaginal discharge is gotten worse.  Is sexually active, does use condoms.  Denies any swelling around her anus to her buttocks, no fevers or chills.  No history of abscesses.   Laceration      Home Medications Prior to Admission medications   Medication Sig Start Date End Date Taking? Authorizing Provider  famotidine (PEPCID) 20 MG tablet Take 1 tablet (20 mg total) by mouth 2 (two) times daily. Take 30 minutes before breakfast and dinner 12/27/21 01/26/22  Terald Sleeper, MD  hydroxychloroquine (PLAQUENIL) 200 MG tablet Take 200 mg by mouth every evening. 11/06/20   [provider]  hydrOXYzine (ATARAX/VISTARIL) 25 MG tablet Take 1 tablet (25 mg total) by mouth 3 (three) times daily as needed for anxiety. Patient not taking: Reported on 01/29/2021 10/02/20   Cecilio Asper A, NP  leucovorin (WELLCOVORIN) 5 MG tablet Take 10 mg by mouth once a week. 01/21/21   [provider]  methotrexate (RHEUMATREX) 2.5 MG tablet Take 15 mg by mouth once a week. 10/31/20   [provider]  predniSONE (DELTASONE) 1 MG tablet Take 1 mg by mouth daily with breakfast.    [provider]  predniSONE (DELTASONE) 5 MG tablet Take 5 mg by mouth every evening.    [provider]  sertraline (ZOLOFT) 100 MG tablet Take 100  mg by mouth at bedtime. 12/03/20   [provider]      Allergies    Amoxicillin, Penicillin g, Sertraline, and Penicillins    Review of Systems   Review of Systems  Genitourinary:  Positive for vaginal discharge.  Skin:  Positive for wound.    Physical Exam Updated Vital Signs BP 103/75 (BP Location: Left Arm)   Pulse 88   Temp 98.4 F (36.9 C) (Oral)   Resp 16   Ht 5\' 4"  (1.626 m)   Wt 48.5 kg   SpO2 100%   BMI 18.37 kg/m  Physical Exam Exam conducted with a chaperone present.  Constitutional:      Appearance: Normal appearance.  HENT:     Head: Normocephalic and atraumatic.     Nose: Nose normal.     Mouth/Throat:     Mouth: Mucous membranes are moist.  Eyes:     Extraocular Movements: Extraocular movements intact.     Pupils: Pupils are equal, round, and reactive to light.  Cardiovascular:     Rate and Rhythm: Normal rate and regular rhythm.  Pulmonary:     Effort: Pulmonary effort is normal.  Abdominal:     General: Abdomen is flat.     Palpations: Abdomen is soft.  Genitourinary:    General: Normal vulva.     Labia:        Right: No rash.  Left: No rash.      Vagina: Vaginal discharge present.  Skin:    General: Skin is warm.     Capillary Refill: Capillary refill takes less than 2 seconds.     Comments: +4cm linear area of maceration from anus to intergluteal cleft   Neurological:     Mental Status: She is alert.    ED Results / Procedures / Treatments   Labs (all labs ordered are listed, but only abnormal results are displayed) Labs Reviewed - No data to display  EKG None  Radiology No results found.  Procedures Procedures  {Document cardiac monitor, telemetry assessment procedure when appropriate:1}  Medications Ordered in ED Medications - No data to display  ED Course/ Medical Decision Making/ A&P                           Medical Decision Making Amount and/or Complexity of Data Reviewed Labs:  ordered.   ***  {Document critical care time when appropriate:1} {Document review of labs and clinical decision tools ie heart score, Chads2Vasc2 etc:1}  {Document your independent review of radiology images, and any outside records:1} {Document your discussion with family members, caretakers, and with consultants:1} {Document social determinants of health affecting pt's care:1} {Document your decision making why or why not admission, treatments were needed:1} Final Clinical Impression(s) / ED Diagnoses Final diagnoses:  None    Rx / DC Orders ED Discharge Orders     None

## 2022-01-05 NOTE — Discharge Instructions (Addendum)
Please follow-up with your PCP for syphillis and HIV testing. Place calazime on the skin breakdown and ensure area is kept dry. Return to ED for abdominal pain, worsening discharge, fever, chills, red streaking on the skin.

## 2022-01-06 LAB — GC/CHLAMYDIA PROBE AMP (~~LOC~~) NOT AT ARMC
Chlamydia: NEGATIVE
Comment: NEGATIVE
Comment: NORMAL
Neisseria Gonorrhea: NEGATIVE

## 2022-01-11 ENCOUNTER — Encounter (HOSPITAL_COMMUNITY): Payer: Self-pay

## 2022-01-11 ENCOUNTER — Emergency Department (HOSPITAL_COMMUNITY)

## 2022-01-11 ENCOUNTER — Emergency Department (HOSPITAL_COMMUNITY)
Admission: EM | Admit: 2022-01-11 | Discharge: 2022-01-11 | Disposition: A | Discharge status: Home / Self Care | Attending: Emergency Medicine | Admitting: Emergency Medicine

## 2022-01-11 ENCOUNTER — Emergency Department (HOSPITAL_BASED_OUTPATIENT_CLINIC_OR_DEPARTMENT_OTHER): Admit: 2022-01-11 | Discharge: 2022-01-11 | Disposition: A | Discharge status: Home / Self Care

## 2022-01-11 DIAGNOSIS — R531 Weakness: Secondary | ICD-10-CM | POA: Diagnosis not present

## 2022-01-11 DIAGNOSIS — M79661 Pain in right lower leg: Secondary | ICD-10-CM | POA: Insufficient documentation

## 2022-01-11 DIAGNOSIS — M79662 Pain in left lower leg: Secondary | ICD-10-CM | POA: Diagnosis not present

## 2022-01-11 DIAGNOSIS — R52 Pain, unspecified: Secondary | ICD-10-CM | POA: Diagnosis not present

## 2022-01-11 DIAGNOSIS — D72819 Decreased white blood cell count, unspecified: Secondary | ICD-10-CM | POA: Diagnosis not present

## 2022-01-11 DIAGNOSIS — M79604 Pain in right leg: Secondary | ICD-10-CM

## 2022-01-11 LAB — CBC WITH DIFFERENTIAL/PLATELET
Abs Immature Granulocytes: 0.01 10*3/uL (ref 0.00–0.07)
Basophils Absolute: 0 10*3/uL (ref 0.0–0.1)
Basophils Relative: 1 %
Eosinophils Absolute: 0 10*3/uL (ref 0.0–0.5)
Eosinophils Relative: 1 %
HCT: 36.5 % (ref 36.0–46.0)
Hemoglobin: 12.4 g/dL (ref 12.0–15.0)
Immature Granulocytes: 0 %
Lymphocytes Relative: 31 %
Lymphs Abs: 0.7 10*3/uL (ref 0.7–4.0)
MCH: 32.9 pg (ref 26.0–34.0)
MCHC: 34 g/dL (ref 30.0–36.0)
MCV: 96.8 fL (ref 80.0–100.0)
Monocytes Absolute: 0.3 10*3/uL (ref 0.1–1.0)
Monocytes Relative: 12 %
Neutro Abs: 1.2 10*3/uL — ABNORMAL LOW (ref 1.7–7.7)
Neutrophils Relative %: 55 %
Platelets: 322 10*3/uL (ref 150–400)
RBC: 3.77 MIL/uL — ABNORMAL LOW (ref 3.87–5.11)
RDW: 11 % — ABNORMAL LOW (ref 11.5–15.5)
WBC: 2.3 10*3/uL — ABNORMAL LOW (ref 4.0–10.5)
nRBC: 0 % (ref 0.0–0.2)

## 2022-01-11 LAB — BASIC METABOLIC PANEL
Anion gap: 7 (ref 5–15)
BUN: 13 mg/dL (ref 6–20)
CO2: 29 mmol/L (ref 22–32)
Calcium: 9.8 mg/dL (ref 8.9–10.3)
Chloride: 105 mmol/L (ref 98–111)
Creatinine, Ser: 0.6 mg/dL (ref 0.44–1.00)
GFR, Estimated: >60 mL/min (ref >60)
Glucose, Bld: 96 mg/dL (ref 70–99)
Potassium: 3.6 mmol/L (ref 3.5–5.1)
Sodium: 141 mmol/L (ref 135–145)

## 2022-01-11 LAB — I-STAT BETA HCG BLOOD, ED (MC, WL, AP ONLY): I-stat hCG, quantitative: <5 m[IU]/mL (ref <5)

## 2022-01-11 MED ORDER — HYDROCODONE-ACETAMINOPHEN 5-325 MG PO TABS: 1.0000 | ORAL_TABLET | Freq: Three times a day (TID) | ORAL | 0 refills | Status: DC | PRN

## 2022-01-11 MED ORDER — HYDROCODONE-ACETAMINOPHEN 5-325 MG PO TABS
1.0000 | ORAL_TABLET | Freq: Once | ORAL | Status: AC
  Administered 2022-01-11: 1 via ORAL
  Filled 2022-01-11: qty 1

## 2022-01-11 NOTE — ED Triage Notes (Signed)
Pt c/o bilateral leg pain starting in her hip area radiating down her legs. Pt states she has hx of lupus. Pt denies and known trauma/falls. Using a cane to walk to triage.

## 2022-01-11 NOTE — ED Provider Notes (Signed)
[facility]-EMERGENCY DEPT Provider Note   CSN: [id number] Arrival date & time: 01/11/22  1351     History  Chief Complaint  Patient presents with   Leg Pain    Tonya Gaines is a 26 y.o. female.  HPI With a history of lupus presents with bilateral leg pain and weakness.  Onset was 2 days ago, no obvious precipitant.  Since that time symptoms have been persistent, possibly more proximal and distal, both sensation of weakness bilaterally.  She has been taking her medication as directed including hydroxychloroquine.  Note no fever, no abdominal pain no other complaints.    Home Medications Prior to Admission medications   Medication Sig Start Date End Date Taking? Authorizing Provider  HYDROcodone-acetaminophen (NORCO/VICODIN) 5-325 MG tablet Take 1 tablet by mouth 3 (three) times daily as needed for severe pain. 01/11/22  Yes Gerhard Munch, MD  doxycycline (VIBRAMYCIN) 100 MG capsule Take 1 capsule (100 mg total) by mouth 2 (two) times daily for 7 days. 01/05/22 01/12/22  Small, Brooke L, PA  famotidine (PEPCID) 20 MG tablet Take 1 tablet (20 mg total) by mouth 2 (two) times daily. Take 30 minutes before breakfast and dinner 12/27/21 01/26/22  Terald Sleeper, MD  hydroxychloroquine (PLAQUENIL) 200 MG tablet Take 200 mg by mouth every evening. 11/06/20   [provider]  hydrOXYzine (ATARAX/VISTARIL) 25 MG tablet Take 1 tablet (25 mg total) by mouth 3 (three) times daily as needed for anxiety. Patient not taking: Reported on 01/29/2021 10/02/20   Cecilio Asper A, NP  leucovorin (WELLCOVORIN) 5 MG tablet Take 10 mg by mouth once a week. 01/21/21   [provider]  methotrexate (RHEUMATREX) 2.5 MG tablet Take 15 mg by mouth once a week. 10/31/20   [provider]  predniSONE (DELTASONE) 1 MG tablet Take 1 mg by mouth daily with breakfast.    [provider]  predniSONE (DELTASONE) 5 MG tablet Take 5 mg by mouth every evening.     [provider]  sertraline (ZOLOFT) 100 MG tablet Take 100 mg by mouth at bedtime. 12/03/20   [provider]      Allergies    Amoxicillin, Penicillin g, Sertraline, and Penicillins    Review of Systems   Review of Systems  All other systems reviewed and are negative.   Physical Exam Updated Vital Signs BP 115/84   Pulse 75   Temp 98.3 F (36.8 C) (Oral)   Resp 10   SpO2 100%  Physical Exam Vitals and nursing note reviewed.  Constitutional:      General: She is not in acute distress.    Appearance: She is well-developed.  HENT:     Head: Normocephalic and atraumatic.  Eyes:     Conjunctiva/sclera: Conjunctivae normal.  Cardiovascular:     Rate and Rhythm: Normal rate and regular rhythm.  Pulmonary:     Effort: Pulmonary effort is normal. No respiratory distress.     Breath sounds: Normal breath sounds. No stridor.  Abdominal:     General: There is no distension.  Musculoskeletal:     Comments: Leg symmetric, no deformities, the patient flexes and extends each hip, knee, ankle.  Skin:    General: Skin is warm and dry.  Neurological:     Mental Status: She is alert and oriented to person, place, and time.     Cranial Nerves: No cranial nerve deficit.     Comments: Strength proximal and distal in the lower extremities, as well  as reflexes are unremarkable.  Psychiatric:        Mood and Affect: Mood normal.     ED Results / Procedures / Treatments   Labs (all labs ordered are listed, but only abnormal results are displayed) Labs Reviewed  CBC WITH DIFFERENTIAL/PLATELET - Abnormal; Notable for the following components:      Result Value   WBC 2.3 (*)    RBC 3.77 (*)    RDW 11.0 (*)    Neutro Abs 1.2 (*)    All other components within normal limits  BASIC METABOLIC PANEL  I-STAT BETA HCG BLOOD, ED ([facility], [facility], [facility] ONLY)    EKG None  Radiology VAS [place] LOWER EXTREMITY VENOUS (DVT) (7a-7p)  Result Date: 01/11/2022  Lower Venous DVT Study  Patient Name:  Tonya Gaines  Date of Exam:   01/11/2022 Medical Rec #: [id number]       Accession #:    [id number] Date of Birth: February 23, 1996       Patient Gender: F Patient Age:   26 years Exam Location:  [facility] Procedure:      VAS [place] LOWER EXTREMITY VENOUS (DVT) Referring Phys: ABIGAIL HARRIS --------------------------------------------------------------------------------  Indications: Pain.  Risk Factors: None identified. Comparison Study: No prior studies. Performing Technologist: Chanda Busing RVT  Examination Guidelines: A complete evaluation includes B-mode imaging, spectral Doppler, color Doppler, and power Doppler as needed of all accessible portions of each vessel. Bilateral testing is considered an integral part of a complete examination. Limited examinations for reoccurring indications may be performed as noted. The reflux portion of the exam is performed with the patient in reverse Trendelenburg.  +---------+---------------+---------+-----------+----------+--------------+ RIGHT    CompressibilityPhasicitySpontaneityPropertiesThrombus Aging +---------+---------------+---------+-----------+----------+--------------+ CFV      Full           Yes      Yes                                 +---------+---------------+---------+-----------+----------+--------------+ SFJ      Full                                                        +---------+---------------+---------+-----------+----------+--------------+ FV Prox  Full                                                        +---------+---------------+---------+-----------+----------+--------------+ FV Mid   Full                                                        +---------+---------------+---------+-----------+----------+--------------+ FV DistalFull                                                        +---------+---------------+---------+-----------+----------+--------------+ PFV      Full                                                         +---------+---------------+---------+-----------+----------+--------------+  POP      Full           Yes      Yes                                 +---------+---------------+---------+-----------+----------+--------------+ PTV      Full                                                        +---------+---------------+---------+-----------+----------+--------------+ PERO     Full                                                        +---------+---------------+---------+-----------+----------+--------------+   +---------+---------------+---------+-----------+----------+--------------+ LEFT     CompressibilityPhasicitySpontaneityPropertiesThrombus Aging +---------+---------------+---------+-----------+----------+--------------+ CFV      Full           Yes      Yes                                 +---------+---------------+---------+-----------+----------+--------------+ SFJ      Full                                                        +---------+---------------+---------+-----------+----------+--------------+ FV Prox  Full                                                        +---------+---------------+---------+-----------+----------+--------------+ FV Mid   Full                                                        +---------+---------------+---------+-----------+----------+--------------+ FV DistalFull                                                        +---------+---------------+---------+-----------+----------+--------------+ PFV      Full                                                        +---------+---------------+---------+-----------+----------+--------------+ POP      Full           Yes      Yes                                 +---------+---------------+---------+-----------+----------+--------------+  PTV      Full                                                         +---------+---------------+---------+-----------+----------+--------------+ PERO     Full                                                        +---------+---------------+---------+-----------+----------+--------------+     Summary: RIGHT: - There is no evidence of deep vein thrombosis in the lower extremity.  - No cystic structure found in the popliteal fossa.  LEFT: - There is no evidence of deep vein thrombosis in the lower extremity.  - No cystic structure found in the popliteal fossa.  *See table(s) above for measurements and observations. Electronically signed by Waverly Ferrari MD on 01/11/2022 at 7:11:43 PM.    Final    DG Lumbar Spine Complete  Result Date: 01/11/2022 CLINICAL DATA:  Lower back and bilateral lower extremity pain. EXAM: LUMBAR SPINE - COMPLETE 4+ VIEW COMPARISON:  None Available. FINDINGS: There is no evidence of lumbar spine fracture. Alignment is normal. Intervertebral disc spaces are maintained. IMPRESSION: Negative. Electronically Signed   By: Lupita Raider M.D.   On: 01/11/2022 15:37    Procedures Procedures    Medications Ordered in ED Medications  HYDROcodone-acetaminophen (NORCO/VICODIN) 5-325 MG per tablet 1 tablet (has no administration in time range)    ED Course/ Medical Decision Making/ A&P This patient with a Hx of lupus presents to the ED for concern of pain and weakness, this involves an extensive number of treatment options, and is a complaint that carries with it a high risk of complications and morbidity.    The differential diagnosis includes autoimmune progression, DVT, fracture, musculoskeletal disease   Social Determinants of Health:  Lupus  Additional history obtained:  Additional history and/or information obtained from chart review, notable for patient has had 2 ED visits in the past month   After the initial evaluation, orders, including: From triage, ultrasound, x-ray, labs were initiated.   Patient placed on  Cardiac and Pulse-Oximetry Monitors. The patient was maintained on a cardiac monitor.  The cardiac monitored showed an rhythm of 75 sinus normal The patient was also maintained on pulse oximetry. The readings were typically 100% room air normal   On repeat evaluation of the patient stayed the same  Lab Tests:  I personally interpreted labs.  The pertinent results include: Unremarkable labs, mild leukopenia, typical for this patient  Imaging Studies ordered:  I independently visualized and interpreted imaging which showed no fracture, no DVT on ultrasound I agree with the radiologist interpretation Dispostion / Final MDM:  After consideration of the diagnostic results and the patient's response to treatment, patient is appropriate for follow-up with either rheumatology or primary care.  Though she is presenting with discomfort, weakness/pain in lower extremities, there is no evidence for objective neurologic dysfunction.  She does have some pain with range of motion, suggesting possible inflammatory condition, but no evidence for systemic disease.  No evidence for bacteremia or sepsis, no evidence for DVT, patient appropriate for discharge with outpatient follow-up.   Final Clinical Impression(s) /  ED Diagnoses Final diagnoses:  Pain in both lower extremities    Rx / DC Orders ED Discharge Orders          Ordered    HYDROcodone-acetaminophen (NORCO/VICODIN) 5-325 MG tablet  3 times daily PRN        01/11/22 2013              Carmin Muskrat, MD 01/11/22 2013

## 2022-01-11 NOTE — Discharge Instructions (Addendum)
As discussed, your evaluation today has been largely reassuring.  But, it is important that you monitor your condition carefully, and do not hesitate to return to the ED if you develop new, or concerning changes in your condition. ? ?Otherwise, please follow-up with your physician for appropriate ongoing care. ? ?

## 2022-01-11 NOTE — Progress Notes (Signed)
Bilateral lower extremity venous duplex has been completed. Preliminary results can be found in CV Proc through chart review.  Results were given to Margarita Mail PA.  01/11/22 7:01 PM Tonya Gaines RVT

## 2022-01-11 NOTE — ED Provider Triage Note (Signed)
Emergency Medicine Provider Triage Evaluation Note  Tonya Gaines , a 26 y.o. female  was evaluated in triage.  Pt complains of low back pain onset 2 days ago, no clear inciting event she reports aching pain right worse than left to occasionally radiate down her right upper legs.  She reports that she feels somewhat weak with motion.   Review of Systems  Positive: Low back pain with radiation down legs, subjective weakness bilateral legs Negative: Fall/injury, fever, chills, saddle paresthesias, bowel/bladder incontinence, urinary retention, extremity numbness, abdominal pain or any additional concerns  Physical Exam  BP 113/84 (BP Location: Left Arm)   Pulse 100   Temp 98.2 F (36.8 C) (Oral)   Resp 18   SpO2 100%  Gen:   Awake, no distress   Resp:  Normal effort  MSK:   Moves extremities without difficulty.  No midline spinal tenderness palpation.  No crepitus step-off deformity spine.  Right greater than left bar muscular tenderness palpation. Right gluteal muscular tenderness palpation.  Appropriate and equal strength with bilateral hip flexion, knee extension, knee flexion, dorsi/plantarflexion and EHL extension.  Pedal pulses intact and equal.  Sensation intact in all distributions of bilateral lower extremities.  DTR intact and equal bilateral patella.  No clonus of the feet. Other:  Abdomen soft nontender  Medical Decision Making  Medically screening exam initiated at 2:05 PM.  Appropriate orders placed.  Chandni Ram was informed that the remainder of the evaluation will be completed by another provider, this initial triage assessment does not replace that evaluation, and the importance of remaining in the ED until their evaluation is complete.   Note: Portions of this report may have been transcribed using voice recognition software. Every effort was made to ensure accuracy; however, inadvertent computerized transcription errors may still be present.    Deliah Boston,  PA-C 01/11/22 1408

## 2022-01-13 ENCOUNTER — Ambulatory Visit
Admit: 2022-01-13 | Discharge: 2022-01-14 | Payer: TRICARE (CHAMPUS) | Attending: Internal Medicine | Primary: Internal Medicine

## 2022-01-13 DIAGNOSIS — M329 Systemic lupus erythematosus, unspecified: Principal | ICD-10-CM

## 2022-01-13 DIAGNOSIS — R29898 Other symptoms and signs involving the musculoskeletal system: Principal | ICD-10-CM

## 2022-01-13 MED ORDER — PREDNISONE 10 MG TABLET
ORAL_TABLET | ORAL | 0 refills | 20 days | Status: CP
Start: 2022-01-13 — End: 2022-02-02

## 2022-02-13 DIAGNOSIS — M329 Systemic lupus erythematosus, unspecified: Principal | ICD-10-CM

## 2022-03-01 DIAGNOSIS — M329 Systemic lupus erythematosus, unspecified: Principal | ICD-10-CM

## 2022-03-07 ENCOUNTER — Ambulatory Visit: Admit: 2022-03-07 | Discharge: 2022-03-08 | Payer: TRICARE (CHAMPUS)

## 2022-03-10 ENCOUNTER — Ambulatory Visit: Admit: 2022-03-10 | Discharge: 2022-03-11 | Payer: TRICARE (CHAMPUS) | Attending: Family | Primary: Family

## 2022-03-10 DIAGNOSIS — M329 Systemic lupus erythematosus, unspecified: Principal | ICD-10-CM

## 2022-03-10 DIAGNOSIS — R202 Paresthesia of skin: Principal | ICD-10-CM

## 2022-03-10 MED ORDER — HYDROXYCHLOROQUINE 200 MG TABLET
ORAL_TABLET | Freq: Every day | ORAL | 3 refills | 90 days | Status: CP
Start: 2022-03-10 — End: 2023-03-10

## 2022-03-10 MED ORDER — PREDNISONE 10 MG TABLET
ORAL_TABLET | ORAL | 0 refills | 30 days | Status: CP
Start: 2022-03-10 — End: 2022-04-09

## 2022-03-13 DIAGNOSIS — M329 Systemic lupus erythematosus, unspecified: Principal | ICD-10-CM

## 2022-03-28 DIAGNOSIS — M329 Systemic lupus erythematosus, unspecified: Principal | ICD-10-CM

## 2022-03-28 MED ORDER — MYCOPHENOLATE MOFETIL 500 MG TABLET
ORAL_TABLET | 1 refills | 0 days | Status: CP
Start: 2022-03-28 — End: ?

## 2022-03-30 DIAGNOSIS — M329 Systemic lupus erythematosus, unspecified: Principal | ICD-10-CM

## 2022-03-30 MED ORDER — BELIMUMAB 200 MG/ML SUBCUTANEOUS AUTO-INJECTOR
SUBCUTANEOUS | 11 refills | 0 days | Status: CP
Start: 2022-03-30 — End: ?

## 2022-04-01 DIAGNOSIS — M329 Systemic lupus erythematosus, unspecified: Principal | ICD-10-CM

## 2022-05-05 ENCOUNTER — Emergency Department (HOSPITAL_BASED_OUTPATIENT_CLINIC_OR_DEPARTMENT_OTHER)

## 2022-05-05 ENCOUNTER — Emergency Department (HOSPITAL_BASED_OUTPATIENT_CLINIC_OR_DEPARTMENT_OTHER)
Admission: EM | Admit: 2022-05-05 | Discharge: 2022-05-06 | Disposition: A | Discharge status: Home / Self Care | Attending: Emergency Medicine | Admitting: Emergency Medicine

## 2022-05-05 ENCOUNTER — Other Ambulatory Visit: Payer: Self-pay

## 2022-05-05 DIAGNOSIS — D72819 Decreased white blood cell count, unspecified: Secondary | ICD-10-CM | POA: Insufficient documentation

## 2022-05-05 DIAGNOSIS — R072 Precordial pain: Secondary | ICD-10-CM | POA: Diagnosis not present

## 2022-05-05 DIAGNOSIS — R739 Hyperglycemia, unspecified: Secondary | ICD-10-CM | POA: Diagnosis not present

## 2022-05-05 DIAGNOSIS — R079 Chest pain, unspecified: Secondary | ICD-10-CM | POA: Diagnosis present

## 2022-05-05 LAB — CBC
HCT: 32.9 % — ABNORMAL LOW (ref 36.0–46.0)
Hemoglobin: 11.8 g/dL — ABNORMAL LOW (ref 12.0–15.0)
MCH: 32.3 pg (ref 26.0–34.0)
MCHC: 35.9 g/dL (ref 30.0–36.0)
MCV: 90.1 fL (ref 80.0–100.0)
Platelets: 251 10*3/uL (ref 150–400)
RBC: 3.65 MIL/uL — ABNORMAL LOW (ref 3.87–5.11)
RDW: 10.6 % — ABNORMAL LOW (ref 11.5–15.5)
WBC: 1.8 10*3/uL — ABNORMAL LOW (ref 4.0–10.5)
nRBC: 0 % (ref 0.0–0.2)

## 2022-05-05 LAB — BASIC METABOLIC PANEL
Anion gap: 6 (ref 5–15)
BUN: 12 mg/dL (ref 6–20)
CO2: 23 mmol/L (ref 22–32)
Calcium: 9.5 mg/dL (ref 8.9–10.3)
Chloride: 107 mmol/L (ref 98–111)
Creatinine, Ser: 0.67 mg/dL (ref 0.44–1.00)
GFR, Estimated: >60 mL/min (ref >60)
Glucose, Bld: 149 mg/dL — ABNORMAL HIGH (ref 70–99)
Potassium: 3.7 mmol/L (ref 3.5–5.1)
Sodium: 136 mmol/L (ref 135–145)

## 2022-05-05 LAB — TROPONIN I (HIGH SENSITIVITY): Troponin I (High Sensitivity): <2 ng/L (ref <18)

## 2022-05-05 LAB — PREGNANCY, URINE: Preg Test, Ur: NEGATIVE

## 2022-05-05 NOTE — ED Triage Notes (Signed)
Pt with mid sternal chest pain and into her chin which is tingling.  Started a few hours ago. Was seen at an urgent care for migraine tx this a.m. and received IM medications. Headache has resolved.

## 2022-05-06 LAB — CBC WITH DIFFERENTIAL/PLATELET
Abs Immature Granulocytes: 0.01 10*3/uL (ref 0.00–0.07)
Basophils Absolute: 0 10*3/uL (ref 0.0–0.1)
Basophils Relative: 0 %
Eosinophils Absolute: 0 10*3/uL (ref 0.0–0.5)
Eosinophils Relative: 0 %
HCT: 33 % — ABNORMAL LOW (ref 36.0–46.0)
Hemoglobin: 11.8 g/dL — ABNORMAL LOW (ref 12.0–15.0)
Immature Granulocytes: 1 %
Lymphocytes Relative: 12 %
Lymphs Abs: 0.2 10*3/uL — ABNORMAL LOW (ref 0.7–4.0)
MCH: 32.3 pg (ref 26.0–34.0)
MCHC: 35.8 g/dL (ref 30.0–36.0)
MCV: 90.4 fL (ref 80.0–100.0)
Monocytes Absolute: 0.1 10*3/uL (ref 0.1–1.0)
Monocytes Relative: 6 %
Neutro Abs: 1.4 10*3/uL — ABNORMAL LOW (ref 1.7–7.7)
Neutrophils Relative %: 81 %
Platelets: 263 10*3/uL (ref 150–400)
RBC: 3.65 MIL/uL — ABNORMAL LOW (ref 3.87–5.11)
RDW: 10.6 % — ABNORMAL LOW (ref 11.5–15.5)
WBC: 1.8 10*3/uL — ABNORMAL LOW (ref 4.0–10.5)
nRBC: 0 % (ref 0.0–0.2)

## 2022-05-06 LAB — TROPONIN I (HIGH SENSITIVITY): Troponin I (High Sensitivity): <2 ng/L (ref <18)

## 2022-05-06 MED ORDER — ONDANSETRON 4 MG PO TBDP
8.0000 mg | ORAL_TABLET | Freq: Once | ORAL | Status: AC
  Administered 2022-05-06: 8 mg via ORAL
  Filled 2022-05-06: qty 2

## 2022-05-06 MED ORDER — HYDROCODONE-ACETAMINOPHEN 5-325 MG PO TABS
1.0000 | ORAL_TABLET | Freq: Once | ORAL | Status: AC
  Administered 2022-05-06: 1 via ORAL
  Filled 2022-05-06: qty 1

## 2022-05-06 NOTE — ED Provider Notes (Signed)
[facility] Provider Note   CSN: [id number] Arrival date & time: 05/05/22  2245     History  Chief Complaint  Patient presents with   Chest Pain    Tonya Gaines is a 27 y.o. female.  The history is provided by the patient.  Patient with history of lupus presents with chest pain. Patient went to an outside urgent care for atypical migraine and was treated with multiple medications.  Patient reports that headache improved, but about an hour later she started having chest pain.  She reports its pain and pressure in her chest and at times will go into her chin.  She reports an episode of vomiting just prior to the chest pain.  She reports mild shortness of breath.  No active fevers.  She denies cough or hemoptysis.  No diaphoresis.  Symptoms are now improving.  No new abdominal pain.  She tried taking Tums without relief.  No new lower extremity edema.  Patient reports current medications are Plaquenil and Benlysta infusions for her SLE.  Denies any known history of VTE/CAD.   She is a non-smoker.  Past Medical History:  Diagnosis Date   Lupus ([place])     Home Medications Prior to Admission medications   Medication Sig Start Date End Date Taking? Authorizing Provider  hydroxychloroquine (PLAQUENIL) 200 MG tablet Take 200 mg by mouth every evening. 11/06/20   [provider]      Allergies    Amoxicillin, Penicillin g, Sertraline, and Penicillins    Review of Systems   Review of Systems  Respiratory:  Positive for shortness of breath.   Cardiovascular:  Positive for chest pain. Negative for leg swelling.    Physical Exam Updated Vital Signs BP 101/75   Pulse 79   Temp 98.5 F (36.9 C) (Oral)   Resp 17   SpO2 100%  Physical Exam CONSTITUTIONAL: Thin appearing, no acute distress, standing up looking at her phone HEAD: Normocephalic/atraumatic EYES: EOMI/PERRL ENMT: Mucous membranes moist NECK: supple no meningeal  signs CV: S1/S2 noted, no murmurs/rubs/gallops noted LUNGS: Lungs are clear to auscultation bilaterally, no apparent distress ABDOMEN: soft, nontender, no rebound or guarding, bowel sounds noted throughout abdomen GU:no cva tenderness NEURO: Pt is awake/alert/appropriate, moves all extremitiesx4.  No facial droop.   EXTREMITIES: pulses normal/equal, full ROM, no lower extremity edema or tenderness SKIN: warm, color normal PSYCH: no abnormalities of mood noted, alert and oriented to situation  ED Results / Procedures / Treatments   Labs (all labs ordered are listed, but only abnormal results are displayed) Labs Reviewed  BASIC METABOLIC PANEL - Abnormal; Notable for the following components:      Result Value   Glucose, Bld 149 (*)    All other components within normal limits  CBC - Abnormal; Notable for the following components:   WBC 1.8 (*)    RBC 3.65 (*)    Hemoglobin 11.8 (*)    HCT 32.9 (*)    RDW 10.6 (*)    All other components within normal limits  CBC WITH DIFFERENTIAL/PLATELET - Abnormal; Notable for the following components:   WBC 1.8 (*)    RBC 3.65 (*)    Hemoglobin 11.8 (*)    HCT 33.0 (*)    RDW 10.6 (*)    Neutro Abs 1.4 (*)    Lymphs Abs 0.2 (*)    All other components within normal limits  PREGNANCY, URINE  TROPONIN I (HIGH SENSITIVITY)  TROPONIN I (  HIGH SENSITIVITY)    EKG EKG Interpretation  Date/Time:  Thursday May 05 2022 22:56:15 EST Ventricular Rate:  101 PR Interval:  118 QRS Duration: 96 QT Interval:  338 QTC Calculation: 439 R Axis:   28 Text Interpretation: Sinus tachycardia Borderline T abnormalities, anterior leads Baseline wander in lead(s) II No significant change since last tracing Confirmed by Zadie Rhine ([id number]) on 05/05/2022 11:53:03 PM  Radiology DG Chest 2 View  Result Date: 05/05/2022 CLINICAL DATA:  Chest pain EXAM: CHEST - 2 VIEW COMPARISON:  None Available. FINDINGS: The heart size and mediastinal contours are  within normal limits. Both lungs are clear. The visualized skeletal structures are unremarkable. IMPRESSION: No active cardiopulmonary disease. Electronically Signed   By: Helyn Numbers M.D.   On: 05/05/2022 23:21    Procedures Procedures    Medications Ordered in ED Medications  ondansetron (ZOFRAN-ODT) disintegrating tablet 8 mg (8 mg Oral Given 05/06/22 0013)  HYDROcodone-acetaminophen (NORCO/VICODIN) 5-325 MG per tablet 1 tablet (1 tablet Oral Given 05/06/22 0014)    ED Course/ Medical Decision Making/ A&P Clinical Course as of 05/06/22 0155  Thu May 05, 2022  2351 Glucose(!): 149 Mild hyperglycemia [DW]  Fri May 06, 2022  0036 Patient noted to have leukopenia, but on review of chart she has had this previously.  She has managed by [facility] rheumatology.  She is currently managed on Plaquenil and Benlysta-she will need to call her rheumatologist about the new findings and they can treat her for this. [DW]  1914 Patient resting comfortably, no acute distress.  Vitals are improved.  No hypoxia.  Given 2 negative troponins, low suspicion for acute coronary syndrome.  No hypoxia to suggest PE and symptoms were not consistent with PE.  Patient has no signs of aortic dissection.  She is safe for discharge home.  She was informed to call her rheumatologist today for follow-up of her leukopenia likely induced by her medications [DW]    Clinical Course User Index [DW] Zadie Rhine, MD             HEART Score: 3                Medical Decision Making Amount and/or Complexity of Data Reviewed Labs: ordered. Decision-making details documented in ED Course. Radiology: ordered.  Risk Prescription drug management.   This patient presents to the ED for concern of chest pain, this involves an extensive number of treatment options, and is a complaint that carries with it a high risk of complications and morbidity.  The differential diagnosis includes but is not limited to acute coronary syndrome,  aortic dissection, pulmonary embolism, pericarditis, pneumothorax, pneumonia, myocarditis, pleurisy, esophageal rupture   Comorbidities that complicate the patient evaluation: Patient's presentation is complicated by their history of lupus   Additional history obtained: Records reviewed Care Everywhere/External Records  Lab Tests: I Ordered, and personally interpreted labs.  The pertinent results include: Leukopenia, hyperglycemia  Imaging Studies ordered: I ordered imaging studies including X-ray chest   I independently visualized and interpreted imaging which showed no acute findings I agree with the radiologist interpretation  Cardiac Monitoring: The patient was maintained on a cardiac monitor.  I personally viewed and interpreted the cardiac monitor which showed an underlying rhythm of:  sinus rhythm  Medicines ordered and prescription drug management: I ordered medication including Vicodin for pain Reevaluation of the patient after these medicines showed that the patient    improved  Test Considered: Patient with heart score of 3, with 2  negative troponins, no further workup indicated.  Will refer to cardiology   Reevaluation: After the interventions noted above, I reevaluated the patient and found that they have :improved  Complexity of problems addressed: Patient's presentation is most consistent with  acute presentation with potential threat to life or bodily function  Disposition: After consideration of the diagnostic results and the patient's response to treatment,  I feel that the patent would benefit from discharge   .           Final Clinical Impression(s) / ED Diagnoses Final diagnoses:  Precordial pain    Rx / DC Orders ED Discharge Orders          Ordered    Ambulatory referral to Cardiology       Comments: If you have not heard from the Cardiology office within the next 72 hours please call [phone].   05/06/22 0122               Ripley Fraise, MD 05/06/22 [phone]

## 2022-05-06 NOTE — Discharge Instructions (Addendum)
Please call the rheumatologist Homero Fellers today and tell them your white blood cell count is low and they need to get it treated   Your caregiver has diagnosed you as having chest pain that is not specific for one problem, but does not require admission.  Chest pain comes from many different causes.  SEEK IMMEDIATE MEDICAL ATTENTION IF: You have severe chest pain, especially if the pain is crushing or pressure-like and spreads to the arms, back, neck, or jaw, or if you have sweating, nausea (feeling sick to your stomach), or shortness of breath. THIS IS AN EMERGENCY. Don't wait to see if the pain will go away. Get medical help at once. Call 911 or 0 (operator). DO NOT drive yourself to the hospital.  Your chest pain gets worse and does not go away with rest.  You have an attack of chest pain lasting longer than usual, despite rest and treatment with the medications your caregiver has prescribed.  You wake from sleep with chest pain or shortness of breath.  You feel dizzy or faint.  You have chest pain not typical of your usual pain for which you originally saw your caregiver.

## 2022-05-06 NOTE — ED Notes (Signed)
Lab called to add on diff to cbc

## 2022-05-11 ENCOUNTER — Encounter: Payer: Self-pay | Admitting: Cardiovascular Disease

## 2022-05-11 ENCOUNTER — Ambulatory Visit: Attending: Cardiovascular Disease | Admitting: Cardiovascular Disease

## 2022-05-11 VITALS — BP 82/52 | HR 87 | Ht 64.0 in | Wt 106.8 lb

## 2022-05-11 DIAGNOSIS — R0789 Other chest pain: Secondary | ICD-10-CM | POA: Diagnosis not present

## 2022-05-11 NOTE — Patient Instructions (Signed)
Medication Instructions:  Your physician recommends that you continue on your current medications as directed. Please refer to the Current Medication list given to you today.  *If you need a refill on your cardiac medications before your next appointment, please call your pharmacy*   Testing/Procedures: Your physician has requested that you have an echocardiogram. Echocardiography is a painless test that uses sound waves to create images of your heart. It provides your doctor with information about the size and shape of your heart and how well your heart's chambers and valves are working. This procedure takes approximately one hour. There are no restrictions for this procedure. Please do NOT wear cologne, perfume, aftershave, or lotions (deodorant is allowed). Please arrive 15 minutes prior to your appointment time. This procedure will be done at [address]    Follow-Up: At [facility], you and your health needs are our priority.  As part of our continuing mission to provide you with exceptional heart care, we have created designated Provider Care Teams.  These Care Teams include your primary Cardiologist (physician) and Advanced Practice Providers (APPs -  Physician Assistants and Nurse Practitioners) who all work together to provide you with the care you need, when you need it.  We recommend signing up for the patient portal called "MyChart".  Sign up information is provided on this After Visit Summary.  MyChart is used to connect with patients for Virtual Visits (Telemedicine).  Patients are able to view lab/test results, encounter notes, upcoming appointments, etc.  Non-urgent messages can be sent to your provider as well.   To learn more about what you can do with MyChart, go to [email or web address].    Your next appointment:   We will see you on an as needed basis.  Provider:   Quay Burow, MD

## 2022-05-11 NOTE — Progress Notes (Signed)
05/11/2022 Tonya Gaines   [birth year]  [id number]  Primary Physician Tapp, Dannielle Burn, MD Primary Cardiologist: Lorretta Harp MD Tonya Gaines, [place]  HPI:  Tonya Gaines is a 27 y.o. thin-appearing single African-American female with no children is accompanied by her mother Tonya Gaines.  Her grandfather is Tonya Gaines who is a patient of mine as well.  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  She wants to get a PhD.  She has no cardiac risk factors.  There is no family history for heart disease.  She was diagnosed with lupus 5/21 and is on Plaquenil.  She gets occasional atypical chest pain.  She presented to the ER 05/06/2022 10:45 in the evening with again several hours before and was constant, and pleuritic.  Her workup was unrevealing.  She has had 1 recurrent episode.   Current Meds  Medication Sig   Belimumab 200 MG/ML SOAJ Inject 200 mg into the skin every 30 (thirty) days.   hydroxychloroquine (PLAQUENIL) 200 MG tablet Take 200 mg by mouth every evening.   lamoTRIgine (LAMICTAL) 25 MG tablet Take 25 mg by mouth 2 (two) times daily.     Allergies  Allergen Reactions   Amoxicillin Other (See Comments)    Did it involve swelling of the face/tongue/throat, SOB, or low BP? Unknown Did it involve sudden or severe rash/hives, skin peeling, or any reaction on the inside of your mouth or nose? Unknown Did you need to seek medical attention at a hospital or doctor's office? Unknown When did it last happen?      childhood If all above answers are "NO", may proceed with cephalosporin use.    Penicillin G Other (See Comments)    Did it involve swelling of the face/tongue/throat, SOB, or low BP? Unknown Did it involve sudden or severe rash/hives, skin peeling, or any reaction on the inside of your mouth or nose? Unknown Did you need to seek medical attention at a hospital or doctor's office? Unknown When did it last happen?  unknown     If all above answers are "NO", may  proceed with cephalosporin use.    Sertraline Other (See Comments)   Penicillins Other (See Comments), Rash and Hives    Did it involve swelling of the face/tongue/throat, SOB, or low BP? Unknown Did it involve sudden or severe rash/hives, skin peeling, or any reaction on the inside of your mouth or nose? Unknown Did you need to seek medical attention at a hospital or doctor's office? Unknown When did it last happen?      childhood If all above answers are "NO", may proceed with cephalosporin use. Did it involve swelling of the face/tongue/throat, SOB, or low BP? Unknown Did it involve sudden or severe rash/hives, skin peeling, or any reaction on the inside of your mouth or nose? Unknown Did you need to seek medical attention at a hospital or doctor's office? Unknown When did it last happen?      childhood If all above answers are "NO", may proceed with cephalosporin use. Did it involve swelling of the face/tongue/throat, SOB, or low BP? Unknown Did it involve sudden or severe rash/hives, skin peeling, or any reaction on the inside of your mouth or nose? Unknown Did you need to seek medical attention at a hospital or doctor's office? Unknown When did it last happen?  unknown     If all above answers are "NO", may proceed with cephalosporin use. Did it involve swelling of the face/tongue/throat,  SOB, or low BP? Unknown Did it involve sudden or severe rash/hives, skin peeling, or any reaction on the inside of your mouth or nose? Unknown Did you need to seek medical attention at a hospital or doctor's office? Unknown When did it last happen?  unknown     If all above answers are "NO", may proceed with cephalosporin use. Did it involve swelling of the face/tongue/throat, SOB, or low BP? Unknown Did it involve sudden or severe rash/hives, skin peeling, or any reaction on the inside of your mouth or nose? Unknown Did you need to seek medical attention at a hospital or doctor's office?  Unknown When did it last happen?      childhood If all above answers are "NO", may proceed with cephalosporin use. Did it involve swelling of the face/tongue/throat, SOB, or low BP? Unknown Did it involve sudden or severe rash/hives, skin peeling, or any reaction on the inside of your mouth or nose? Unknown Did you need to seek medical attention at a hospital or doctor's office? Unknown When did it last happen?      childhood If all above answers are "NO", may proceed with cephalosporin use. Did it involve swelling of the face/tongue/throat, SOB, or low BP? Unknown Did it involve sudden or severe rash/hives, skin peeling, or any reaction on the inside of your mouth or nose? Unknown Did you need to seek medical attention at a hospital or doctor's office? Unknown When did it last happen?  unknown     If all above answers are "NO", may proceed with cephalosporin use. Did it involve swelling of the face/tongue/throat, SOB, or low BP? Unknown Did it involve sudden or severe rash/hives, skin peeling, or any reaction on the inside of your mouth or nose? Unknown Did you need to seek medical attention at a hospital or doctor's office? Unknown When did it last happen?  unknown     If all above answers are "NO", may proceed with cephalosporin use.     Social History   Socioeconomic History   Marital status: Single    Spouse name: Not on file   Number of children: Not on file   Years of education: Not on file   Highest education level: Not on file  Occupational History   Not on file  Tobacco Use   Smoking status: Never   Smokeless tobacco: Never  Vaping Use   Vaping Use: Never used  Substance and Sexual Activity   Alcohol use: Yes   Drug use: Not Currently   Sexual activity: Yes    Birth control/protection: I.U.D.  Other Topics Concern   Not on file  Social History Narrative   Not on file   Social Determinants of Health   Financial Resource Strain: Not on file  Food Insecurity:  Not on file  Transportation Needs: Not on file  Physical Activity: Not on file  Stress: Not on file  Social Connections: Not on file  Intimate Partner Violence: Not on file     Review of Systems: General: negative for chills, fever, night sweats or weight changes.  Cardiovascular: negative for chest pain, dyspnea on exertion, edema, orthopnea, palpitations, paroxysmal nocturnal dyspnea or shortness of breath Dermatological: negative for rash Respiratory: negative for cough or wheezing Urologic: negative for hematuria Abdominal: negative for nausea, vomiting, diarrhea, bright red blood per rectum, melena, or hematemesis Neurologic: negative for visual changes, syncope, or dizziness All other systems reviewed and are otherwise negative except as noted above.    Blood  pressure (!) 82/52, pulse 87, height 5\' 4"  (1.626 m), weight 106 lb 12.8 oz (48.4 kg), SpO2 95 %.  General appearance: alert and no distress Neck: no adenopathy, no carotid bruit, no JVD, supple, symmetrical, trachea midline, and thyroid not enlarged, symmetric, no tenderness/mass/nodules Lungs: clear to auscultation bilaterally Heart: regular rate and rhythm, S1, S2 normal, no murmur, click, rub or gallop Extremities: extremities normal, atraumatic, no cyanosis or edema Pulses: 2+ and symmetric Skin: Skin color, texture, turgor normal. No rashes or lesions Neurologic: Grossly normal  EKG sinus rhythm at 87 without ST or T wave changes.  She did have an RSR prime in lead V1 and V2 suggesting RV conduction delay.  I personally reviewed this EKG.  ASSESSMENT AND PLAN:   Atypical chest pain Ms. Udovich is referred by the emergency room (Dr. Christy Gentles) where she was seen on 05/05/2022 with atypical chest pain.  She does carry a history of lupus.  She had pain that began 2 hours or so prior to presentation.  The pain was constant and somewhat pleuritic.  Workup was nonrevealing.  Her enzymes were negative and EKG showed no acute  changes.  The pain lasted for several hours after then resolved.  She had a slight recurrent pain subsequent to that.  She has no cardiac risk factors.  Her exam is benign today.  EKG shows sinus rhythm without acute changes.  I doubt her pain is ischemically mediated.  It sounds more pleuropericarditis.  I am going to check a 2D echocardiogram.     Lorretta Harp MD [facility], [facility] 05/11/2022 3:19 PM

## 2022-05-11 NOTE — Assessment & Plan Note (Signed)
Tonya Gaines is referred by the emergency room (Dr. Christy Gentles) where she was seen on 05/05/2022 with atypical chest pain.  She does carry a history of lupus.  She had pain that began 2 hours or so prior to presentation.  The pain was constant and somewhat pleuritic.  Workup was nonrevealing.  Her enzymes were negative and EKG showed no acute changes.  The pain lasted for several hours after then resolved.  She had a slight recurrent pain subsequent to that.  She has no cardiac risk factors.  Her exam is benign today.  EKG shows sinus rhythm without acute changes.  I doubt her pain is ischemically mediated.  It sounds more pleuropericarditis.  I am going to check a 2D echocardiogram.

## 2022-05-19 DIAGNOSIS — M329 Systemic lupus erythematosus, unspecified: Principal | ICD-10-CM

## 2022-05-19 MED ORDER — PREDNISONE 5 MG TABLET
ORAL_TABLET | ORAL | 0 refills | 20 days | Status: CP
Start: 2022-05-19 — End: 2022-06-07

## 2022-06-04 DIAGNOSIS — M329 Systemic lupus erythematosus, unspecified: Principal | ICD-10-CM

## 2022-06-07 ENCOUNTER — Ambulatory Visit (HOSPITAL_COMMUNITY): Attending: Cardiovascular Disease

## 2022-06-07 DIAGNOSIS — R0789 Other chest pain: Secondary | ICD-10-CM | POA: Diagnosis present

## 2022-06-07 LAB — ECHOCARDIOGRAM COMPLETE
Area-P 1/2: 4.52 cm2
S' Lateral: 2.9 cm

## 2022-06-11 ENCOUNTER — Emergency Department (HOSPITAL_COMMUNITY)
Admission: EM | Admit: 2022-06-11 | Discharge: 2022-06-11 | Disposition: A | Discharge status: Home / Self Care | Attending: Emergency Medicine | Admitting: Emergency Medicine

## 2022-06-11 ENCOUNTER — Encounter (HOSPITAL_COMMUNITY): Payer: Self-pay

## 2022-06-11 ENCOUNTER — Ambulatory Visit (HOSPITAL_COMMUNITY)
Admission: EM | Admit: 2022-06-11 | Discharge: 2022-06-11 | Disposition: A | Discharge status: Home / Self Care | Payer: No Typology Code available for payment source | Source: Ambulatory Visit | Attending: Emergency Medicine | Admitting: Emergency Medicine

## 2022-06-11 DIAGNOSIS — X58XXXA Exposure to other specified factors, initial encounter: Secondary | ICD-10-CM | POA: Diagnosis not present

## 2022-06-11 DIAGNOSIS — T7421XA Adult sexual abuse, confirmed, initial encounter: Secondary | ICD-10-CM

## 2022-06-11 DIAGNOSIS — Z23 Encounter for immunization: Secondary | ICD-10-CM | POA: Insufficient documentation

## 2022-06-11 DIAGNOSIS — S80211A Abrasion, right knee, initial encounter: Secondary | ICD-10-CM | POA: Insufficient documentation

## 2022-06-11 DIAGNOSIS — S50311A Abrasion of right elbow, initial encounter: Secondary | ICD-10-CM | POA: Diagnosis not present

## 2022-06-11 DIAGNOSIS — Z0441 Encounter for examination and observation following alleged adult rape: Secondary | ICD-10-CM | POA: Diagnosis not present

## 2022-06-11 LAB — COMPREHENSIVE METABOLIC PANEL
ALT: 12 U/L (ref 0–44)
AST: 21 U/L (ref 15–41)
Albumin: 4.7 g/dL (ref 3.5–5.0)
Alkaline Phosphatase: 52 U/L (ref 38–126)
Anion gap: 10 (ref 5–15)
BUN: <5 mg/dL — ABNORMAL LOW (ref 6–20)
CO2: 25 mmol/L (ref 22–32)
Calcium: 9.5 mg/dL (ref 8.9–10.3)
Chloride: 103 mmol/L (ref 98–111)
Creatinine, Ser: 0.6 mg/dL (ref 0.44–1.00)
GFR, Estimated: >60 mL/min (ref >60)
Glucose, Bld: 81 mg/dL (ref 70–99)
Potassium: 3.4 mmol/L — ABNORMAL LOW (ref 3.5–5.1)
Sodium: 138 mmol/L (ref 135–145)
Total Bilirubin: 0.7 mg/dL (ref 0.3–1.2)
Total Protein: 8.8 g/dL — ABNORMAL HIGH (ref 6.5–8.1)

## 2022-06-11 LAB — RAPID HIV SCREEN (HIV 1/2 AB+AG)
HIV 1/2 Antibodies: NONREACTIVE
HIV-1 P24 Antigen - HIV24: NONREACTIVE

## 2022-06-11 LAB — POC URINE PREG, ED: Preg Test, Ur: NEGATIVE

## 2022-06-11 LAB — HEPATITIS C ANTIBODY: HCV Ab: NONREACTIVE

## 2022-06-11 LAB — HEPATITIS B SURFACE ANTIGEN: Hepatitis B Surface Ag: NONREACTIVE

## 2022-06-11 MED ORDER — PROMETHAZINE HCL 25 MG PO TABS
25.0000 mg | ORAL_TABLET | Freq: Four times a day (QID) | ORAL | Status: DC | PRN
  Administered 2022-06-11: 25 mg via ORAL

## 2022-06-11 MED ORDER — METRONIDAZOLE 500 MG PO TABS
2000.0000 mg | ORAL_TABLET | Freq: Once | ORAL | Status: AC
  Administered 2022-06-11: 2000 mg via ORAL

## 2022-06-11 MED ORDER — CEFTRIAXONE SODIUM 500 MG IJ SOLR
500.0000 mg | Freq: Once | INTRAMUSCULAR | Status: AC
  Administered 2022-06-11: 500 mg via INTRAMUSCULAR

## 2022-06-11 MED ORDER — ACETAMINOPHEN 500 MG PO TABS
1000.0000 mg | ORAL_TABLET | Freq: Once | ORAL | Status: AC
  Administered 2022-06-11: 1000 mg via ORAL
  Filled 2022-06-11: qty 2

## 2022-06-11 MED ORDER — AZITHROMYCIN 250 MG PO TABS
1000.0000 mg | ORAL_TABLET | Freq: Once | ORAL | Status: AC
  Administered 2022-06-11: 1000 mg via ORAL

## 2022-06-11 MED ORDER — LIDOCAINE HCL (PF) 1 % IJ SOLN
1.0000 mL | Freq: Once | INTRAMUSCULAR | Status: AC
  Administered 2022-06-11: 2 mL

## 2022-06-11 MED ORDER — ELVITEG-COBIC-EMTRICIT-TENOFAF 150-150-200-10 MG PREPACK
1.0000 | ORAL_TABLET | Freq: Once | ORAL | Status: AC
  Administered 2022-06-11: 1 via ORAL
  Filled 2022-06-11: qty 1

## 2022-06-11 MED ORDER — TETANUS-DIPHTH-ACELL PERTUSSIS 5-2.5-18.5 LF-MCG/0.5 IM SUSY
0.5000 mL | PREFILLED_SYRINGE | Freq: Once | INTRAMUSCULAR | Status: AC
  Administered 2022-06-11: 0.5 mL via INTRAMUSCULAR
  Filled 2022-06-11: qty 0.5

## 2022-06-11 MED ORDER — ELVITEG-COBIC-EMTRICIT-TENOFAF 150-150-200-10 MG PO TABS
1.0000 | ORAL_TABLET | Freq: Every day | ORAL | 0 refills | Status: AC
  Filled 2022-06-13 (×2): qty 30, 30d supply, fill #0
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
## 2022-06-11 NOTE — ED Notes (Signed)
Gave pt lunch pack and water

## 2022-06-11 NOTE — Discharge Instructions (Addendum)
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 the garbage or flushed down the toilet. If you are not sure, ask your care team. If it is safe to put it in the trash, take the medication out of the container. Mix the medication with cat litter, dirt, coffee grounds, or other unwanted substance. Seal the mixture in a bag or container. Put it in the  trash. NOTE: This sheet is a summary. It may not cover all possible information. If you have questions about this medicine, talk to your doctor, pharmacist, or health care provider.  2022 Elsevier/Gold Standard (2020-05-21 13:29:17)  Elvitegravir; Cobicistat; Emtricitabine; Tenofovir Alafenamide oral tablets  What is this medication? ELVITEGRAVIR; COBICISTAT; EMTRICITABINE; TENOFOVIR ALAFENAMIDE (el vye TEG ra veer; koe BIS i stat; em tri SIT uh bean; te NOE fo veer) is 3 antiretroviral  medicines and a medication booster in 1 tablet. It is used to treat HIV. This medicine is not a cure for HIV. This medicine can lower, but not fully prevent, the risk of spreading HIV to others. This medicine may be used for other purposes; ask your health care provider or pharmacist if you have questions. COMMON BRAND NAME(S): Genvoya What should I tell my care team before I take this medication? They need to know if you have any of these conditions: kidney disease liver disease an unusual or allergic reaction to elvitegravir, cobicistat, emtricitabine, tenofovir, other medicines, foods, dyes, or preservatives pregnant or trying to get pregnant breast-feeding How should I use this medication? Take this medicine by mouth with a glass of water. Follow the directions on the prescription label. Take this medicine with food. Take your medicine at regular intervals. Do not take your medicine more often than directed. For your anti-HIV therapy to work as well as possible, take each dose exactly as prescribed. Do not skip doses or stop your medicine even if you feel better. Skipping doses may make the HIV virus resistant to this medicine and other medicines. Do not stop taking except on your doctor's advice. Talk to your pediatrician regarding the use of this medicine in children. While this drug may be prescribed for selected conditions, precautions do apply. Overdosage: If you think you have taken too much of this medicine contact a poison control center or emergency room at once. NOTE: This medicine is only for you. Do not share this medicine with others. What if I miss a dose? If you miss a dose, take it as soon as you can. If it is almost time for your next dose, take only that dose. Do not take double or extra doses. What may interact with this medication? Do not take this medicine with any of the following medications: adefovir alfuzosin certain medicines for seizures like carbamazepine,  phenobarbital, phenytoin cisapride irinotecan lumacaftor; ivacaftor lurasidone medicines for cholesterol like lovastatin, simvastatin medicines for headaches like dihydroergotamine, ergotamine, methylergonovine midazolam naloxegol other antiviral medicines for HIV or AIDS pimozide rifampin sildenafil [facility]. John's wort triazolam This medicine may also interact with the following medications: antacids atorvastatin bosentan buprenorphine; naloxone certain antibiotics like clarithromycin, telithromycin, rifabutin, rifapentine certain medications for anxiety or sleep like buspirone, clorazepate, diazepam, estazolam, flurazepam, zolpidem certain medicines for blood pressure or heart disease like amlodipine, diltiazem, felodipine, metoprolol, nicardipine, nifedipine, timolol, verapamil certain medicines for depression, anxiety, or psychiatric disturbances certain medicines for erectile dysfunction like avanafil, sildenafil, tadalafil, vardenafil certain medicines for fungal infection like itraconazole, ketoconazole, voriconazole certain medicines that treat or prevent blood clots like warfarin, apixaban, betrixaban, dabigatran, edoxaban, and rivaroxaban colchicine cyclosporine female hormones, like estrogens and progestins and birth control pills medicines for infection like acyclovir, cidofovir, valacyclovir, ganciclovir, valganciclovir medicines for irregular heart beat like amiodarone, bepridil, digoxin, disopyramide, dofetilide, flecainide, lidocaine, mexiletine, propafenone, quinidine metformin oxcarbazepine phenothiazines like perphenazine, risperidone, thioridazine salmeterol sirolimus steroid medicines like betamethasone, budesonide, ciclesonide, dexamethasone, fluticasone, methylprednisolone, mometasone, triamcinolone tacrolimus This list may not describe all possible  interactions. Give your health care provider a list of all the medicines, herbs, non-prescription drugs, or  dietary supplements you use. Also tell them if you smoke, drink alcohol, or use illegal drugs. Some items may interact with your medicine. What should I watch for while using this medication? Visit your doctor or health care professional for regular check ups. Discuss any new symptoms with your doctor. You will need to have important blood work done while on this medicine. HIV is spread to others through sexual or blood contact. Talk to your doctor about how to stop the spread of HIV. If you have hepatitis B, talk to your doctor if you plan to stop this medicine. The symptoms of hepatitis B may get worse if you stop this medicine. Birth control pills may not work properly while you are taking this medicine. Talk to your doctor about using an extra method of birth control. Women who can still have children must use a reliable form of barrier contraception, like a condom. What side effects may I notice from receiving this medication? Side effects that you should report to your doctor or health care professional as soon as possible: allergic reactions like skin rash, itching or hives, swelling of the face, lips, or tongue breathing problems fast, irregular heartbeat muscle pain or weakness signs and symptoms of kidney injury like trouble passing urine or change in the amount of urine signs and symptoms of liver injury like dark yellow or brown urine; general ill feeling or flu-like symptoms; light-colored stools; loss of appetite; right upper belly pain; unusually weak or tired; yellowing of the eyes or skin Side effects that usually do not require medical attention (report to your doctor or health care professional if they continue or are bothersome): diarrhea headache nausea tiredness This list may not describe all possible side effects. Call your doctor for medical advice about side effects. You may report side effects to FDA at 1-800-FDA-1088. Where should I keep my medication? Keep out of the  reach of children. Store at room temperature below 30 degrees C (86 degrees F). Throw away any unused medicine after the expiration date. NOTE: This sheet is a summary. It may not cover all possible information. If you have questions about this medicine, talk to your doctor, pharmacist, or health care provider.  2022 Elsevier/Gold Standard (2019-02-26 17:54:51)   Promethazine (pack of 3 for home use) Also known as:  Phenergan  Promethazine Tablets  What is this medication? PROMETHAZINE (proe METH a zeen) prevents and treats the symptoms of an allergic reaction. It works by blocking histamine, a substance released by the body during an allergic reaction. It may also help you relax, go to sleep, and relieve nausea, vomiting, or pain before or after procedures. It can also prevent and treat motion sickness. It works by helping your nervous system calm down by blocking substances in the body that may cause nausea and vomiting. It belongs to a group of medications called antihistamines. This medicine may be used for other purposes; ask your health care provider or pharmacist if you have questions. COMMON BRAND NAME(S): Phenergan What should I tell my care team before I take this medication? They need to know if you have any of these conditions: Blockage in your bowel Diabetes Glaucoma Have trouble controlling your muscles Heart disease Liver disease Low blood counts, like low white cell, platelet, or red cell counts Lung or breathing disease, like asthma Parkinson's disease Prostate disease Seizures Stomach or intestine problems Trouble passing urine  An unusual or allergic reaction to promethazine, sulfites, other medications, foods, dyes, or preservatives Pregnant or trying to get pregnant Breast-feeding How should I use this medication? Take this medication by mouth with a glass of water. Follow the directions on the prescription label. Take your doses at regular intervals. Do not  take your medication more often than directed. Talk to your care team about the use of this medication in children. Special care may be needed. This medication should not be given to infants and children younger than 30 years old. Overdosage: If you think you have taken too much of this medicine contact a poison control center or emergency room at once. NOTE: This medicine is only for you. Do not share this medicine with others. What if I miss a dose? If you miss a dose, take it as soon as you can. If it is almost time for your next dose, take only that dose. Do not take double or extra doses. What may interact with this medication? Alcohol Antihistamines for allergy, cough, and cold Atropine Certain medications for anxiety or sleep Certain medications for bladder problems like oxybutynin, tolterodine Certain medications for depression like amitriptyline, fluoxetine, sertraline Certain medications for Parkinson's disease like benztropine, trihexyphenidyl Certain medications for stomach problems like dicyclomine, hyoscyamine Certain medications for travel sickness like scopolamine Epinephrine General anesthetics like halothane, isoflurane, methoxyflurane, propofol Ipratropium MAOIs like Marplan, Nardil, and Parnate Medications for high blood pressure Medications for seizures like phenobarbital, primidone, phenytoin Medications that relax muscles for surgery Metoclopramide Narcotic medications for pain This list may not describe all possible interactions. Give your health care provider a list of all the medicines, herbs, non-prescription drugs, or dietary supplements you use. Also tell them if you smoke, drink alcohol, or use illegal drugs. Some items may interact with your medicine. What should I watch for while using this medication? Visit your care team for regular checks on your progress. Tell your care team if symptoms do not start to get better or if they get worse. You may get drowsy  or dizzy. Do not drive, use machinery, or do anything that needs mental alertness until you know how this medication affects you. To reduce the risk of dizzy or fainting spells, do not stand or sit up quickly, especially if you are an older patient. Alcohol may increase dizziness and drowsiness. Avoid alcoholic drinks. Your mouth may get dry. Chewing sugarless gum or sucking hard candy, and drinking plenty of water may help. Contact your care team if the problem does not go away or is severe. This medication may cause dry eyes and blurred vision. If you wear contact lenses you may feel some discomfort. Lubricating drops may help. See your care team if the problem does not go away or is severe. This medication can make you more sensitive to the sun. Keep out of the sun. If you cannot avoid being in the sun, wear protective clothing and use sunscreen. Do not use sun lamps or tanning beds/booths. This medication may increase blood sugar. Ask your care team if changes in diet or medications are needed if you have diabetes. What side effects may I notice from receiving this medication? Side effects that you should report to your care team as soon as possible: Allergic reactions-skin rash, itching, hives, swelling of the face, lips, tongue, or throat CNS depression-slow or shallow breathing, shortness of breath, feeling faint, dizziness, confusion, trouble staying awake High fever stiff muscles, increased sweating, fast or irregular heartbeat, and confusion, which may  be signs of neuroleptic malignant syndrome Infection-fever, chills, cough, or sore throat Liver injury-right upper belly pain, loss of appetite, nausea, light-colored stool, dark yellow or brown urine, yellowing skin or eyes, unusual weakness or fatigue Seizures Sudden eye pain or change in vision such as blurry vision, seeing halos around lights, vision loss Trouble passing urine Uncontrolled and repetitive body movements, muscle stiffness or  spasms, tremors or shaking, loss of balance or coordination, restlessness, shuffling walk, which may be signs of extrapyramidal symptoms (EPS) Side effects that usually do not require medical attention (report to your care team if they continue or are bothersome): Confusion Constipation Dizziness Drowsiness Dry mouth Sensitivity to light Vivid dreams or nightmares This list may not describe all possible side effects. Call your doctor for medical advice about side effects. You may report side effects to FDA at 1-800-FDA-1088. Where should I keep my medication? Keep out of the reach of children. Store at room temperature, between 20 and 25 degrees C (68 and 77 degrees F). Protect from light. Throw away any unused medication after the expiration date. NOTE: This sheet is a summary. It may not cover all possible information. If you have questions about this medicine, talk to your doctor, pharmacist, or health care provider.  2022 Elsevier/Gold Standard (2020-07-07 10:57:27)
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
## 2022-06-12 ENCOUNTER — Encounter (HOSPITAL_BASED_OUTPATIENT_CLINIC_OR_DEPARTMENT_OTHER): Payer: Self-pay | Admitting: Emergency Medicine

## 2022-06-12 ENCOUNTER — Other Ambulatory Visit: Payer: Self-pay

## 2022-06-12 ENCOUNTER — Emergency Department (HOSPITAL_BASED_OUTPATIENT_CLINIC_OR_DEPARTMENT_OTHER)
Admission: EM | Admit: 2022-06-12 | Discharge: 2022-06-12 | Disposition: A | Discharge status: Home / Self Care | Attending: Emergency Medicine | Admitting: Emergency Medicine

## 2022-06-12 DIAGNOSIS — N939 Abnormal uterine and vaginal bleeding, unspecified: Secondary | ICD-10-CM | POA: Diagnosis not present

## 2022-06-12 LAB — RPR: RPR Ser Ql: NONREACTIVE

## 2022-06-12 MED ORDER — ACETAMINOPHEN 500 MG PO TABS
1000.0000 mg | ORAL_TABLET | Freq: Once | ORAL | Status: AC
  Administered 2022-06-12: 1000 mg via ORAL
  Filled 2022-06-12: qty 2
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
## 2022-06-13 ENCOUNTER — Other Ambulatory Visit (HOSPITAL_COMMUNITY): Payer: Self-pay

## 2022-06-13 ENCOUNTER — Other Ambulatory Visit: Payer: Self-pay

## 2022-06-13 MED ORDER — ELVITEG-COBIC-EMTRICIT-TENOFAF 150-150-200-10 MG PO TABS
ORAL_TABLET | ORAL | 0 refills | Status: AC
  Filled 2022-06-13: qty 30, 30d supply, fill #0

## 2022-06-14 ENCOUNTER — Encounter (HOSPITAL_COMMUNITY): Payer: Self-pay

## 2022-06-14 ENCOUNTER — Other Ambulatory Visit (HOSPITAL_COMMUNITY): Payer: Self-pay

## 2022-06-16 ENCOUNTER — Other Ambulatory Visit (HOSPITAL_COMMUNITY): Payer: Self-pay

## 2022-06-17 MED ORDER — MIRTAZAPINE 15 MG TABLET
ORAL_TABLET | Freq: Every evening | ORAL | 2 refills | 30 days | Status: CP
Start: 2022-06-17 — End: 2023-06-17

## 2022-06-17 MED ORDER — ONDANSETRON HCL 4 MG TABLET
ORAL_TABLET | Freq: Three times a day (TID) | ORAL | 1 refills | 10 days | Status: CP | PRN
Start: 2022-06-17 — End: ?

## 2022-06-29 ENCOUNTER — Ambulatory Visit
Admit: 2022-06-29 | Discharge: 2022-06-30 | Payer: TRICARE (CHAMPUS) | Attending: Physical Medicine & Rehabilitation | Primary: Physical Medicine & Rehabilitation

## 2022-06-30 ENCOUNTER — Ambulatory Visit
Admit: 2022-06-30 | Discharge: 2022-07-01 | Payer: TRICARE (CHAMPUS) | Attending: Internal Medicine | Primary: Internal Medicine

## 2022-06-30 DIAGNOSIS — R202 Paresthesia of skin: Principal | ICD-10-CM

## 2022-06-30 DIAGNOSIS — M329 Systemic lupus erythematosus, unspecified: Principal | ICD-10-CM

## 2022-07-21 MED ORDER — MIRTAZAPINE 15 MG TABLET
ORAL_TABLET | Freq: Every evening | ORAL | 5 refills | 30 days | Status: CP
Start: 2022-07-21 — End: 2023-07-21

## 2022-08-09 ENCOUNTER — Telehealth: Admit: 2022-08-09 | Discharge: 2022-08-10 | Payer: TRICARE (CHAMPUS) | Attending: Registered" | Primary: Registered"

## 2022-08-09 DIAGNOSIS — R636 Underweight: Principal | ICD-10-CM

## 2022-09-11 ENCOUNTER — Encounter (HOSPITAL_BASED_OUTPATIENT_CLINIC_OR_DEPARTMENT_OTHER): Payer: Self-pay

## 2022-09-11 ENCOUNTER — Emergency Department (HOSPITAL_BASED_OUTPATIENT_CLINIC_OR_DEPARTMENT_OTHER)
Admission: EM | Admit: 2022-09-11 | Discharge: 2022-09-11 | Disposition: A | Discharge status: Home / Self Care | Attending: Emergency Medicine | Admitting: Emergency Medicine

## 2022-09-11 ENCOUNTER — Other Ambulatory Visit: Payer: Self-pay

## 2022-09-11 ENCOUNTER — Emergency Department (HOSPITAL_BASED_OUTPATIENT_CLINIC_OR_DEPARTMENT_OTHER)

## 2022-09-11 DIAGNOSIS — H6691 Otitis media, unspecified, right ear: Secondary | ICD-10-CM | POA: Diagnosis not present

## 2022-09-11 DIAGNOSIS — U071 COVID-19: Secondary | ICD-10-CM | POA: Diagnosis not present

## 2022-09-11 DIAGNOSIS — H6690 Otitis media, unspecified, unspecified ear: Secondary | ICD-10-CM

## 2022-09-11 DIAGNOSIS — R059 Cough, unspecified: Secondary | ICD-10-CM | POA: Diagnosis present

## 2022-09-11 LAB — SARS CORONAVIRUS 2 BY RT PCR: SARS Coronavirus 2 by RT PCR: POSITIVE — AB

## 2022-09-11 MED ORDER — ACETAMINOPHEN 500 MG PO TABS
1000.0000 mg | ORAL_TABLET | Freq: Once | ORAL | Status: AC
  Administered 2022-09-11: 1000 mg via ORAL
  Filled 2022-09-11: qty 2

## 2022-09-11 MED ORDER — LACTATED RINGERS IV BOLUS
1000.0000 mL | Freq: Once | INTRAVENOUS | Status: AC
  Administered 2022-09-11: 1000 mL via INTRAVENOUS

## 2022-09-11 MED ORDER — HYDROCODONE BIT-HOMATROP MBR 5-1.5 MG/5ML PO SOLN: 5.0000 mL | Freq: Four times a day (QID) | ORAL | 0 refills | Status: AC | PRN

## 2022-09-11 MED ORDER — AZITHROMYCIN 250 MG PO TABS: 250.0000 mg | ORAL_TABLET | Freq: Every day | ORAL | 0 refills | Status: AC
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
## 2022-09-11 NOTE — Discharge Instructions (Addendum)
Your workup today was reassuring.  Chest x-ray negative for any concerning findings.  COVID test was positive.  Take Tylenol as needed for fever control and bodyaches.  I have sent in a cough syrup to help suppress the cough in case you have severe coughing spells.  Take guaifenesin 1200 mg over-the-counter to help with the cough as well.  Drink plenty of fluids.  Follow-up with your primary care provider.

## 2022-09-11 NOTE — ED Provider Notes (Signed)
[place] [facility] Provider Note   CSN: [id number] Arrival date & time: 09/11/22  1531     History  Chief Complaint  Patient presents with   Cough    Tonya Gaines is a 27 y.o. female.  27 year old female presents with her mom for evaluation of worsening URI symptoms.  Recently diagnosed with COVID infection.  She does have history of lupus.  She states she has pain in the right ear, conjunctivitis, hoarse voice.  Does not take any medications but drinks plenty of fluids prior to arrival.  Endorses intermittent fever.  Endorses cough.  States [employer] are not helping.  Denies other complaints.  The history is provided by the patient. No language interpreter was used.       Home Medications Prior to Admission medications   Medication Sig Start Date End Date Taking? Authorizing Provider  Belimumab 200 MG/ML SOAJ Inject 200 mg into the skin every 30 (thirty) days. 11/23/21   [provider]  elvitegravir-cobicistat-emtricitabine-tenofovir (GENVOYA) 150-150-200-10 MG TABS tablet Take 1 tablet by mouth daily with breakfast. 06/11/22   Cathren Laine, MD  elvitegravir-cobicistat-emtricitabine-tenofovir (GENVOYA) 150-150-200-10 MG TABS tablet Take by mouth. 06/11/22   Cathren Laine, MD  hydroxychloroquine (PLAQUENIL) 200 MG tablet Take 200 mg by mouth every evening. 11/06/20   [provider]  lamoTRIgine (LAMICTAL) 25 MG tablet Take 25 mg by mouth 2 (two) times daily. 07/07/21 07/08/22  [provider]      Allergies    Amoxicillin, Penicillin g, Sertraline, and Penicillins    Review of Systems   Review of Systems  Constitutional:  Negative for chills and fever.  HENT:  Positive for congestion, ear pain, postnasal drip, sore throat and voice change (hoarseness). Negative for ear discharge and trouble swallowing.   Respiratory:  Positive for cough. Negative for shortness of breath.   Cardiovascular:  Negative for chest  pain.  Gastrointestinal:  Negative for abdominal pain.  All other systems reviewed and are negative.   Physical Exam Updated Vital Signs BP 119/77 (BP Location: Right Arm)   Pulse (!) 117   Temp 99.3 F (37.4 C) (Oral)   Resp 16   Ht 5\' 4"  (1.626 m)   Wt 53.5 kg   SpO2 98%   BMI 20.25 kg/m  Physical Exam  ED Results / Procedures / Treatments   Labs (all labs ordered are listed, but only abnormal results are displayed) Labs Reviewed  SARS CORONAVIRUS 2 BY RT PCR    EKG None  Radiology No results found.  Procedures Procedures    Medications Ordered in ED Medications  lactated ringers bolus 1,000 mL (has no administration in time range)  acetaminophen (TYLENOL) tablet 1,000 mg (has no administration in time range)    ED Course/ Medical Decision Making/ A&P                             Medical Decision Making Amount and/or Complexity of Data Reviewed Radiology: ordered.  Risk OTC drugs. Prescription drug management.   27 year old female presents with mom for evaluation of worsening URI symptoms.  Diagnosed with COVID infection a few days ago.  She does have history of lupus.  Reports worsening symptoms.  She is tachycardic.  Will provide IV hydration, Tylenol and reassess.  On exam she does have erythematous right TM.  Consistent with otitis media.  Will cover with Zithromax given she has allergy to amoxicillin.  Will  prescribe Hycodan for persistent cough.  We discussed sinus rinse.  Tachycardia improved with IV fluid bolus.  Patient reports some improvement on reevaluation.  We discussed taking Tylenol 1000 mg every 6-8 hours.  We discussed taking guaifenesin to help with the cough as well.  Patient is appropriate for discharge.  Discharged in stable condition.  Return precautions discussed.  Chest x-ray negative for any acute concerns.    Final Clinical Impression(s) / ED Diagnoses Final diagnoses:  COVID-19  Acute otitis media, unspecified otitis media  type    Rx / DC Orders ED Discharge Orders          Ordered    HYDROcodone bit-homatropine (HYCODAN) 5-1.5 MG/5ML syrup  Every 6 hours PRN        09/11/22 1759    azithromycin (ZITHROMAX Z-PAK) 250 MG tablet  Daily        09/11/22 1759              Marita Kansas, PA-C 09/11/22 1800    Vanetta Mulders, MD 09/13/22 [phone]

## 2022-10-17 NOTE — Unmapped (Signed)
RHEUMATOLOGY RETURN VISIT    Assessment/Plan:  Julie Ruiz is a 27 y.o. female here to followup of  ANA 1:1280 nuclear and speckled, +SSA>8, +SSB>8, arthralgia, leukopenia, fatigue. Started Benlysta infusions with [facility] on 10/29/21. Reporting improvement with starting Benlysta but wanting to switch to Benlysta [place].     1. SLE  - Switch Benlysta to [place].   - Continue plaquenil 200mg  daily. Stable eye exam 03/07/22, see under media.   - Update monitoring labs today: CBCw/diff, Cr, AST, ALT dsDNA, C3/C4, UPC, UA    Immunization History   Administered Date(s) Administered Comments    COVID-19 VAC,BIVALENT(66YR UP),PFIZER 01/06/2021     COVID-19 VACC,MRNA,(PFIZER)(PF) 11/22/2019      12/20/2019     Covid-19 Vac, (82yr+) (Comirnaty) Mrna Pfizer  03/30/2022 Adminis    DTaP 11/08/1997      01/10/1998      03/14/1998      04/02/1999      11/01/2001     DTaP, Unspecified Formulation 11/08/1997 Historical - Not administered in Epic     01/10/1998 Historical - Not administered in Epic     03/14/1998 Historical - Not administered in Epic     04/02/1999 Historical - Not administered in Epic     11/01/2001 Historical - Not administered in Epic    HPV Quadrivalent (Gardasil) 08/08/2020 Historical - Not administered in Epic     11/10/2020 Historical - Not administered in Epic    Hepatitis B Vaccine, Unspecified Formulation 10/14/1997 Historical - Not administered in Epic     09/12/1998 Historical - Not administered in Epic    Hepatitis B vaccine, pediatric/adolescent dosage, 09/11/1997      10/14/1997      09/12/1998     HiB, unspecified 11/08/1997 Historical - Not administered in Epic     01/10/1998 Historical - Not administered in Epic     03/14/1998 Historical - Not administered in Epic     11/19/1998     HiB-PRP-T 11/08/1997      01/10/1998      03/14/1998      11/25/1998     Influenza Vaccine Quad(IM)6 MO-Adult(PF) 05/02/2016      02/09/2021      03/10/2022     Influenza Virus Vaccine, unspecified formulation 05/02/2016 01/02/2017     MMR 11/25/1998      11/01/2001     Meningococcal ACWY, Unspecified Formulation 12/11/2012 Historical - Not administered in Epic    Meningococcal Conjugate MCV4P 12/11/2012      03/19/2014     Polio Virus Vaccine, Unspecified Formulation 11/08/1997 Historical - Not administered in Epic     01/10/1998 Historical - Not administered in Epic     04/02/1999 Historical - Not administered in Epic     11/01/2001 Historical - Not administered in Epic    Poliovirus,inactivated (IPV) 11/08/1997      01/10/1998      04/02/1999      11/01/2001     TdaP 12/29/2008      08/24/2017 Historical - Not administered in Epic    Varicella 09/12/1998    IUD- Mirena placed 2021     F/U as scheduled in October with Dr. Berton Lan     I personally spent 36 minutes face-to-face and non-face-to-face in the care of this patient, which includes all pre, intra, and post visit time on the date of service.  All documented time was specific to the E/M visit and does not include any procedures that may have been performed.  History of Present Illness:  The patient was seen in consultation at the request of Shaune Spittle for the evaluation of CTD.    HPI: Julie Ruiz is a 27 y.o.  female who presents via video for f/u. Review of outside records shows the following: Diagnosed with SLE in [place] in May of 2021. Now living in [place], [place]. Initial presentation included fatigue, joint pain in her hands, elbows and knees but no synovitis, leukopenia, recurrent infection, ANA 1:1280 nuclear and speckled, +SSA>8, +SSB>8, negative ds-DNA, negative smith/RNP, normal C3, C4, normal urinalysis, Normal UPC, ESR 27, TSH normal. Started on prednisone 10 mg daily and HCQ 200 mg daily. Was counseled about risk of being SSA positive and pregnancy. Feels better with these medications.      Otherwise, she has been healthy. 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 Is working on a medical retirement now that she has SLE. Will likely go to school after this.    06/03/21 MTX stopped due to GI side effects, hair loss, headaches and weight loss. Imuran started at 50mg  daily. 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 WBC was also decreased at 1.8.     07/2021 Imuran stopped due to leukopenia.     10/2021 Benlysta infusions started    Last Visit: 06/30/22 with Dr. Val Eagle    Interim History:   Pt presents for f/u today. She is doing well overall. She hasn't had Benlysta for a month though due to mix up in infusions. She is actually wanting to switch to [facility]. She feels benlysta has been helpful for joints. She is taking HCQ. She had stable eye exam 02/2022. She has gained 18lbs since last visit. She feels remeron has been helpful for her appetite. Joints are overall OK. She had some swelling to knuckles last month but now resolved. She had mild facial rash last month but this has resolved. No other rashes. No recent oral ulcers. No recent bloody or frothy urine. She did have COVID at end of May but these have since resolved.     Allergies:  Sertraline, Amoxicillin, and Penicillins    Problem List  Patient Active Problem List   Diagnosis    Encounter for routine child health examination without abnormal findings    On Depo-Provera for contraception    Cyst of right breast    Loss of appetite    Loose stools    Systemic lupus erythematosus (CMS-HCC)    Positive ANA (antinuclear antibody)    Polyarthralgia    Leukopenia    Flank pain, acute    Acute serous otitis media of left ear    Chlamydia    Constipation, acute    Fatigue    Flu-like symptoms    Hemorrhoid    Nausea      Medical History:  Past Medical History:   Diagnosis Date    Anxiety     Depression     Endometriosis     Lupus (CMS-HCC)     Lupus (CMS-HCC)     PTSD (post-traumatic stress disorder)      Surgical History:  Past Surgical History:   Procedure Laterality Date    APPENDECTOMY       Social History:  Social History     Tobacco Use    Smoking status: Never    Smokeless tobacco: Never   Vaping Use    Vaping status: Never Used   Substance Use Topics    Alcohol  use: No     Alcohol/week: 0.0 standard drinks of alcohol    Drug use: Never     Family History:  Family History   Problem Relation Age of Onset    Diabetes Maternal Grandfather     Hypertension Maternal Grandfather     Heart disease Maternal Grandfather     Cancer Maternal Grandfather     No Known Problems Mother     Hyperlipidemia Father     Diabetes Paternal Grandmother     Hypertension Paternal Grandmother     Heart disease Paternal Grandmother      Review of Systems: Please see above in the HPI, the remainder of a 10-system review was unremarkable.    Review of outside records: I have reviewed available records through Epic. Pertinent results are summarized in the HPI.    Objective   Vitals:    10/18/22 1340   BP: 105/74   Pulse: 94   Temp: 37.2 ??C (99 ??F)   TempSrc: Temporal   Weight: 55.8 kg (123 lb)     Physical Exam  General:   Pleasant 27 y.o.female in no acute distress, WDWN, accompanied by her mom   Eyes:   PERRL, conjunctiva and sclera not inflamed. Tears appear adequate.    ENT:   No oropharyngeal lesions. Mucous membranes moist.    Lymph:   No masses or cervical lymphadenopathy.    Cardiovascular:  Regular rate and rhythm. No murmur, rub, or gallop. No lower extremity edema. Pedal pulses +2 b/l.    Lungs:  Clear to auscultation.Normal respiratory effort.    Musculoskeletal:   General: Ambulates w/o assistance   Hands: No swelling or tenderness. Able to make a tight fist b/l   Wrists:FROM w/o swelling or tenderness   Elbows: FROM w/o swelling.   Shoulders: FROM w/o pain   Knees: FROM w/o effusions   Ankles: No swelling or tenderness    Neurological:  CN 2-12 grossly intact. 5/5 strength on extremities.   Psych:  Appropriate affect and mood   Skin:  No rashes.

## 2022-10-18 ENCOUNTER — Ambulatory Visit: Admit: 2022-10-18 | Discharge: 2022-10-19 | Payer: TRICARE (CHAMPUS) | Attending: Family | Primary: Family

## 2022-10-18 DIAGNOSIS — M329 Systemic lupus erythematosus, unspecified: Principal | ICD-10-CM

## 2022-10-18 LAB — CBC W/ AUTO DIFF
BASOPHILS ABSOLUTE COUNT: 0 10*9/L (ref 0.0–0.1)
BASOPHILS RELATIVE PERCENT: 1.1 %
EOSINOPHILS ABSOLUTE COUNT: 0.1 10*9/L (ref 0.0–0.5)
EOSINOPHILS RELATIVE PERCENT: 4.3 %
HEMATOCRIT: 37.2 % (ref 34.0–44.0)
HEMOGLOBIN: 12.6 g/dL (ref 11.3–14.9)
LYMPHOCYTES ABSOLUTE COUNT: 0.6 10*9/L — ABNORMAL LOW (ref 1.1–3.6)
LYMPHOCYTES RELATIVE PERCENT: 19.8 %
MEAN CORPUSCULAR HEMOGLOBIN CONC: 33.8 g/dL (ref 32.0–36.0)
MEAN CORPUSCULAR HEMOGLOBIN: 32.1 pg (ref 25.9–32.4)
MEAN CORPUSCULAR VOLUME: 94.8 fL (ref 77.6–95.7)
MEAN PLATELET VOLUME: 7.6 fL (ref 6.8–10.7)
MONOCYTES ABSOLUTE COUNT: 0.3 10*9/L (ref 0.3–0.8)
MONOCYTES RELATIVE PERCENT: 10.3 %
NEUTROPHILS ABSOLUTE COUNT: 1.9 10*9/L (ref 1.8–7.8)
NEUTROPHILS RELATIVE PERCENT: 64.5 %
PLATELET COUNT: 277 10*9/L (ref 150–450)
RED BLOOD CELL COUNT: 3.93 10*12/L — ABNORMAL LOW (ref 3.95–5.13)
RED CELL DISTRIBUTION WIDTH: 12.9 % (ref 12.2–15.2)
WBC ADJUSTED: 3 10*9/L — ABNORMAL LOW (ref 3.6–11.2)

## 2022-10-18 LAB — URINALYSIS WITH MICROSCOPY WITH CULTURE REFLEX PERFORMABLE
BILIRUBIN UA: NEGATIVE
BLOOD UA: NEGATIVE
GLUCOSE UA: NEGATIVE
KETONES UA: NEGATIVE
LEUKOCYTE ESTERASE UA: NEGATIVE
NITRITE UA: NEGATIVE
PH UA: 7.5 (ref 5.0–9.0)
PROTEIN UA: NEGATIVE
RBC UA: <1 /HPF (ref 0–3)
SPECIFIC GRAVITY UA: 1.02 (ref 1.005–1.030)
SQUAMOUS EPITHELIAL: 6 /HPF — ABNORMAL HIGH (ref 0–5)
UROBILINOGEN UA: 0.2
WBC UA: 1 /HPF (ref 0–3)

## 2022-10-18 LAB — AST: AST (SGOT): 17 U/L (ref <=34)

## 2022-10-18 LAB — CREATININE
CREATININE: 0.64 mg/dL
EGFR CKD-EPI (2021) FEMALE: >90 mL/min/{1.73_m2} (ref >=60)

## 2022-10-18 LAB — PROTEIN / CREATININE RATIO, URINE
CREATININE, URINE: 125.4 mg/dL
PROTEIN URINE: 16.6 mg/dL
PROTEIN/CREAT RATIO, URINE: 0.132

## 2022-10-18 LAB — C4 COMPLEMENT: C4 COMPLEMENT: 25.5 mg/dL (ref 12.0–36.0)

## 2022-10-18 LAB — C3 COMPLEMENT: C3 COMPLEMENT: 109 mg/dL (ref 84–160)

## 2022-10-18 LAB — ALT: ALT (SGPT): 9 U/L — ABNORMAL LOW (ref 10–49)

## 2022-10-18 MED ORDER — BELIMUMAB 200 MG/ML SUBCUTANEOUS AUTO-INJECTOR
SUBCUTANEOUS | 3 refills | 0 days | Status: CP
Start: 2022-10-18 — End: 2023-10-18

## 2022-10-18 NOTE — Unmapped (Signed)
[facility] Specialty Medication Onboarding    Specialty Medication: BENLYSTA 200 mg/mL [place] (belimumab)  Prior Authorization: Approved   Financial Assistance: No - copay  <$25  Final Copay/Day Supply: $24 / 28 days    Insurance Restrictions: Yes - max 1 month supply     Notes to Pharmacist:   Credit Card on File: no    The triage team has completed the benefits investigation and has determined that the patient is able to fill this medication at [facility]. Please contact the patient to complete the onboarding or follow up with the prescribing physician as needed.

## 2022-10-18 NOTE — Unmapped (Signed)
[facility] - Pharmacist Counseling Notes    Julie Ruiz is a 27 y.o. female being initiated on Benlysta for systemic lupus erythematosus. Patient is transitioning from IV to subcutaneous Benlysta.      Counseled patient on cost implications/medication access, dose and administration, potential adverse effect and follow up.      Regimen & Administration:  Benlysta 200 mg under the skin every 7 days .  Instructed to begin 2 weeks after last Benlysta infusion.  Administer without regards to meal.      Storage/Handling/Disposal:  Refridgerated.  Patient will dispose of needles in a sharps container or empty laundry detergent bottle.      Drug-Drug & Drug-Food Interactions:  None noted    Side-effects:    Patient is transitioning from IV to subcutaneous Benlysta and is aware of side effects.    Injection Training:   Reviewed injection techniques and patient felt comfortable with self-administration at home. Patients mother reports using Skyrizi injections for herself and will help patient if necessary. Injection site-reactions discussed.    Follow up & monitoring: Next appointment with Dr. Berton Lan on 02/02/23.    All questions were answered and contact information provided for any future questions/concerns.      Waverly Ferrari  PharmD Candidate [address] [employer] Intern

## 2022-10-19 DIAGNOSIS — M329 Systemic lupus erythematosus, unspecified: Principal | ICD-10-CM

## 2022-10-19 MED ORDER — BELIMUMAB 200 MG/ML SUBCUTANEOUS AUTO-INJECTOR
SUBCUTANEOUS | 3 refills | 0 days | Status: CP
Start: 2022-10-19 — End: 2023-10-19

## 2022-10-19 NOTE — Unmapped (Signed)
Specialty Medication(s): Benlysta    Julie Ruiz has been dis-enrolled from the [facility] specialty pharmacy services due to a pharmacy change. The patient is now filling at [facility] .    Additional information provided to the patient: n/a    [name], PharmD  [facility] Specialty Pharmacist

## 2022-10-26 MED ORDER — MIRTAZAPINE 15 MG TABLET
ORAL_TABLET | Freq: Every evening | ORAL | 5 refills | 30 days | Status: CP
Start: 2022-10-26 — End: 2023-10-26

## 2022-11-07 DIAGNOSIS — M329 Systemic lupus erythematosus, unspecified: Principal | ICD-10-CM

## 2022-11-07 MED ORDER — HYDROXYCHLOROQUINE 200 MG TABLET
ORAL_TABLET | Freq: Every day | ORAL | 3 refills | 90 days
Start: 2022-11-07 — End: 2023-11-07

## 2022-11-08 MED ORDER — HYDROXYCHLOROQUINE 200 MG TABLET
ORAL_TABLET | Freq: Every day | ORAL | 3 refills | 90 days | Status: CP
Start: 2022-11-08 — End: 2023-11-08

## 2022-12-20 DIAGNOSIS — M329 Systemic lupus erythematosus, unspecified: Principal | ICD-10-CM

## 2022-12-20 MED ORDER — PREDNISONE 5 MG TABLET
ORAL_TABLET | ORAL | 0 refills | 20 days | Status: CP
Start: 2022-12-20 — End: 2023-01-08

## 2023-01-16 ENCOUNTER — Encounter (HOSPITAL_BASED_OUTPATIENT_CLINIC_OR_DEPARTMENT_OTHER): Payer: Self-pay | Admitting: Emergency Medicine

## 2023-01-16 ENCOUNTER — Other Ambulatory Visit: Payer: Self-pay

## 2023-01-16 ENCOUNTER — Emergency Department (HOSPITAL_BASED_OUTPATIENT_CLINIC_OR_DEPARTMENT_OTHER)
Admission: EM | Admit: 2023-01-16 | Discharge: 2023-01-16 | Disposition: A | Discharge status: Home / Self Care | Attending: Emergency Medicine | Admitting: Emergency Medicine

## 2023-01-16 ENCOUNTER — Emergency Department (HOSPITAL_BASED_OUTPATIENT_CLINIC_OR_DEPARTMENT_OTHER)

## 2023-01-16 DIAGNOSIS — R1032 Left lower quadrant pain: Secondary | ICD-10-CM | POA: Diagnosis not present

## 2023-01-16 DIAGNOSIS — B9689 Other specified bacterial agents as the cause of diseases classified elsewhere: Secondary | ICD-10-CM

## 2023-01-16 DIAGNOSIS — N760 Acute vaginitis: Secondary | ICD-10-CM | POA: Insufficient documentation

## 2023-01-16 DIAGNOSIS — R3915 Urgency of urination: Secondary | ICD-10-CM

## 2023-01-16 LAB — URINALYSIS, ROUTINE W REFLEX MICROSCOPIC
Bilirubin Urine: NEGATIVE
Glucose, UA: NEGATIVE mg/dL
Hgb urine dipstick: NEGATIVE
Ketones, ur: NEGATIVE mg/dL
Nitrite: NEGATIVE
Protein, ur: NEGATIVE mg/dL
Specific Gravity, Urine: 1.015 (ref 1.005–1.030)
pH: 6.5 (ref 5.0–8.0)

## 2023-01-16 LAB — URINALYSIS, MICROSCOPIC (REFLEX)

## 2023-01-16 LAB — WET PREP, GENITAL
Sperm: NONE SEEN
Trich, Wet Prep: NONE SEEN
WBC, Wet Prep HPF POC: >=10 — AB (ref <10)
Yeast Wet Prep HPF POC: NONE SEEN

## 2023-01-16 LAB — PREGNANCY, URINE: Preg Test, Ur: NEGATIVE

## 2023-01-16 MED ORDER — FOSFOMYCIN TROMETHAMINE 3 G PO PACK
3.0000 g | PACK | Freq: Once | ORAL | Status: AC
  Administered 2023-01-16: 3 g via ORAL
  Filled 2023-01-16: qty 3

## 2023-01-16 MED ORDER — DOXYCYCLINE HYCLATE 100 MG PO CAPS: 100.0000 mg | ORAL_CAPSULE | Freq: Two times a day (BID) | ORAL | 0 refills | Status: AC

## 2023-01-16 MED ORDER — METRONIDAZOLE 0.75 % EX GEL: 1.0000 | Freq: Two times a day (BID) | CUTANEOUS | 0 refills | Status: AC

## 2023-01-16 MED ORDER — ONDANSETRON 4 MG PO TBDP
4.0000 mg | ORAL_TABLET | Freq: Once | ORAL | Status: AC
  Administered 2023-01-16: 4 mg via ORAL
  Filled 2023-01-16: qty 1

## 2023-01-16 MED ORDER — DOXYCYCLINE HYCLATE 100 MG PO TABS
100.0000 mg | ORAL_TABLET | Freq: Once | ORAL | Status: AC
  Administered 2023-01-16: 100 mg via ORAL
  Filled 2023-01-16: qty 1

## 2023-01-16 MED ORDER — CEFTRIAXONE SODIUM 500 MG IJ SOLR
500.0000 mg | Freq: Once | INTRAMUSCULAR | Status: AC
  Administered 2023-01-16: 500 mg via INTRAMUSCULAR
  Filled 2023-01-16: qty 500

## 2023-01-16 NOTE — ED Triage Notes (Addendum)
Pt reports urinary retention that started this morning, reports drinking Gatorade and water but still unable to urinate; reports left sided flank pain, denies hx of kidney stone or retention

## 2023-01-16 NOTE — ED Provider Notes (Signed)
[place] [facility] AT [facility] Provider Note   CSN: [id number] Arrival date & time: 01/16/23  1723     History  Chief Complaint  Patient presents with   Urinary Retention    Tonya Gaines is a 27 y.o. female.  HPI   27 year old female presenting to the emergency department with a complaint of difficulty urinating and a sense of urinary urgency.  She has been drinking Gatorade and water but was feeling as if she was unable to initiate a urinary stream but continue to sense a sensation of urgency.  She denies any active dysuria.  Denies any history of urinary retention or nephrolithiasis.  She endorses some left-sided flank discomfort.  No fevers or chills.  She is sexually active and is less concern for sexually transmitted infection but does consent to STI testing.  She denies any significant vaginal discharge or bleeding and she has an IUD in place.  Home Medications Prior to Admission medications   Medication Sig Start Date End Date Taking? Authorizing Provider  doxycycline (VIBRAMYCIN) 100 MG capsule Take 1 capsule (100 mg total) by mouth 2 (two) times daily for 7 days. 01/16/23 01/23/23 Yes Ernie Avena, MD  metroNIDAZOLE (METROGEL) 0.75 % gel Apply 1 Application topically 2 (two) times daily for 7 days. 01/16/23 01/23/23 Yes Ernie Avena, MD  azithromycin (ZITHROMAX Z-PAK) 250 MG tablet Take 1 tablet (250 mg total) by mouth daily. On day 1 take 2 tablets.  After that take 1 tablet daily. 09/11/22   Karie Mainland, Amjad, PA-C  Belimumab 200 MG/ML SOAJ Inject 200 mg into the skin every 30 (thirty) days. 11/23/21   [provider]  elvitegravir-cobicistat-emtricitabine-tenofovir (GENVOYA) 150-150-200-10 MG TABS tablet Take 1 tablet by mouth daily with breakfast. 06/11/22   Cathren Laine, MD  elvitegravir-cobicistat-emtricitabine-tenofovir (GENVOYA) 150-150-200-10 MG TABS tablet Take by mouth. 06/11/22   Cathren Laine, MD  HYDROcodone bit-homatropine (HYCODAN)  5-1.5 MG/5ML syrup Take 5 mLs by mouth every 6 (six) hours as needed for cough. 09/11/22   Marita Kansas, PA-C  hydroxychloroquine (PLAQUENIL) 200 MG tablet Take 200 mg by mouth every evening. 11/06/20   [provider]  lamoTRIgine (LAMICTAL) 25 MG tablet Take 25 mg by mouth 2 (two) times daily. 07/07/21 07/08/22  [provider]      Allergies    Amoxicillin, Penicillin g, Sertraline, and Penicillins    Review of Systems   Review of Systems  All other systems reviewed and are negative.   Physical Exam Updated Vital Signs BP (!) 146/104 (BP Location: Right Arm)   Pulse 84   Temp 98.3 F (36.8 C)   Resp 18   Ht 5\' 4"  (1.626 m)   Wt 59 kg   SpO2 100%   BMI 22.31 kg/m  Physical Exam Vitals and nursing note reviewed. Exam conducted with a chaperone present.  Constitutional:      General: She is not in acute distress. HENT:     Head: Normocephalic and atraumatic.  Eyes:     Conjunctiva/sclera: Conjunctivae normal.     Pupils: Pupils are equal, round, and reactive to light.  Cardiovascular:     Rate and Rhythm: Normal rate and regular rhythm.  Pulmonary:     Effort: Pulmonary effort is normal. No respiratory distress.  Abdominal:     General: There is no distension.     Tenderness: There is abdominal tenderness. There is left CVA tenderness. There is no guarding.     Comments: LLQ TTP and L CVA TTP  Genitourinary:    Cervix: Cervical motion tenderness and discharge present.     Adnexa: Right adnexa normal and left adnexa normal.     Comments: No lesions, no active bleeding, mild cervical discharge present, mild cervical motion tenderness, no adnexal tenderness Musculoskeletal:        General: No deformity or signs of injury.     Cervical back: Neck supple.  Skin:    Findings: No lesion or rash.  Neurological:     General: No focal deficit present.     Mental Status: She is alert. Mental status is at baseline.     ED Results / Procedures / Treatments    Labs (all labs ordered are listed, but only abnormal results are displayed) Labs Reviewed  WET PREP, GENITAL - Abnormal; Notable for the following components:      Result Value   Clue Cells Wet Prep HPF POC PRESENT (*)    WBC, Wet Prep HPF POC >=10 (*)    All other components within normal limits  URINALYSIS, ROUTINE W REFLEX MICROSCOPIC - Abnormal; Notable for the following components:   Leukocytes,Ua SMALL (*)    All other components within normal limits  URINALYSIS, MICROSCOPIC (REFLEX) - Abnormal; Notable for the following components:   Bacteria, UA RARE (*)    All other components within normal limits  PREGNANCY, URINE  RPR  HIV ANTIBODY (ROUTINE TESTING W REFLEX)  GC/CHLAMYDIA PROBE AMP ([place]) NOT AT [facility]    EKG None  Radiology CT Renal Stone Study  Result Date: 01/16/2023 CLINICAL DATA:  Left flank pain and urinary retention. EXAM: CT ABDOMEN AND PELVIS WITHOUT CONTRAST TECHNIQUE: Multidetector CT imaging of the abdomen and pelvis was performed following the standard protocol without IV contrast. RADIATION DOSE REDUCTION: This exam was performed according to the departmental dose-optimization program which includes automated exposure control, adjustment of the mA and/or kV according to patient size and/or use of iterative reconstruction technique. COMPARISON:  None Available. FINDINGS: Lower chest: No acute abnormality. Hepatobiliary: No focal liver abnormality is seen. The gallbladder is contracted without evidence of gallstones, gallbladder wall thickening, or biliary dilatation. Pancreas: Unremarkable. No pancreatic ductal dilatation or surrounding inflammatory changes. Spleen: Normal in size without focal abnormality. Adrenals/Urinary Tract: Adrenal glands are unremarkable. Kidneys are normal, without renal calculi, focal lesion, or hydronephrosis. Bladder is unremarkable. Stomach/Bowel: Stomach is within normal limits. The appendix is surgically absent. No evidence of  bowel wall thickening, distention, or inflammatory changes. Vascular/Lymphatic: No significant vascular findings are present. No enlarged abdominal or pelvic lymph nodes. Reproductive: A properly positioned IUD is seen within an otherwise normal appearing uterus. The bilateral adnexa are unremarkable. Other: No abdominal wall hernia or abnormality. No abdominopelvic ascites. Musculoskeletal: No acute or significant osseous findings. IMPRESSION: 1. No evidence of nephrolithiasis or obstructive uropathy. 2. Evidence of prior appendectomy. 3. Properly positioned IUD. Electronically Signed   By: Aram Candela M.D.   On: 01/16/2023 21:13    Procedures Procedures    Medications Ordered in ED Medications  ondansetron (ZOFRAN-ODT) disintegrating tablet 4 mg (4 mg Oral Given 01/16/23 1940)  cefTRIAXone (ROCEPHIN) injection 500 mg (500 mg Intramuscular Given 01/16/23 1940)  doxycycline (VIBRA-TABS) tablet 100 mg (100 mg Oral Given 01/16/23 1940)  fosfomycin (MONUROL) packet 3 g (3 g Oral Given 01/16/23 2207)    ED Course/ Medical Decision Making/ A&P  Medical Decision Making Amount and/or Complexity of Data Reviewed Labs: ordered. Radiology: ordered.  Risk Prescription drug management.    27 year old female presenting to the emergency department with a complaint of difficulty urinating and a sense of urinary urgency.  She has been drinking Gatorade and water but was feeling as if she was unable to initiate a urinary stream but continue to sense a sensation of urgency.  She denies any active dysuria.  Denies any history of urinary retention or nephrolithiasis.  She endorses some left-sided flank discomfort.  No fevers or chills.  She is sexually active and is less concern for sexually transmitted infection but does consent to STI testing.  She denies any significant vaginal discharge or bleeding and she has an IUD in place.  On arrival, the patient was vitally stable.   Physical exam revealed mild left lower quadrant tenderness and left-sided CVA tenderness, pelvic exam was performed revealed mild cervical discharge, I thought I was able to briefly visualize an IUD string and the patient states that the her OB provider has had difficulty visualizing the strings.  She had mild cervical motion tenderness on exam, no adnexal tenderness.  Patient was in fact able to urinate and her bladder scan was 0.  No concern for acute urinary retention at this time.  Her urinalysis revealed negative nitrites, small leukocytes, 6-10 WBCs and only rare bacteria present however in the setting of her symptoms will treat empirically with a dose of fosfomycin.  Her urine pregnancy was negative.  HIV and RPR testing was collected with the consent of the patient.  GC/chlamydia was also collected and pending.  The patient was empirically covered for chlamydia with doxycycline and for gonorrhea with a shot of Rocephin.  A CT renal stone study was performed and was ultimately negative for nephrolithiasis, no evidence of pyelonephritis.  Overall reassuring presentation.  CT Stone: IMPRESSION:  1. No evidence of nephrolithiasis or obstructive uropathy.  2. Evidence of prior appendectomy.  3. Properly positioned IUD.      Will discharge the patient on a course of doxycycline and advised the patient to follow-up on the results of her GC testing outpatient. Flagyl gel prescribed for findings of clue cells on wet prep.  Patient provided with return precautions, overall stable for discharge.  Final Clinical Impression(s) / ED Diagnoses Final diagnoses:  Urinary urgency  Bacterial vaginosis    Rx / DC Orders ED Discharge Orders          Ordered    doxycycline (VIBRAMYCIN) 100 MG capsule  2 times daily        01/16/23 2125    metroNIDAZOLE (METROGEL) 0.75 % gel  2 times daily        01/16/23 2210              Ernie Avena, MD 01/16/23 2210

## 2023-01-16 NOTE — Discharge Instructions (Addendum)
We have covered you for UTI and STIs with antibiotics. Your CT imaging was reassuring.  Your wet prep showed evidence of bacterial vaginosis however with minimal symptoms we will treat with Flagyl gel rather than the oral medication.  The remaining test results will be available on the patient portal.

## 2023-01-17 LAB — GC/CHLAMYDIA PROBE AMP (~~LOC~~) NOT AT ARMC
Chlamydia: NEGATIVE
Comment: NEGATIVE
Comment: NORMAL
Neisseria Gonorrhea: NEGATIVE

## 2023-01-17 LAB — RPR: RPR Ser Ql: NONREACTIVE

## 2023-01-17 LAB — HIV ANTIBODY (ROUTINE TESTING W REFLEX): HIV Screen 4th Generation wRfx: NONREACTIVE

## 2023-02-02 ENCOUNTER — Ambulatory Visit
Admit: 2023-02-02 | Discharge: 2023-02-03 | Payer: TRICARE (CHAMPUS) | Attending: Internal Medicine | Primary: Internal Medicine

## 2023-02-02 DIAGNOSIS — M329 Systemic lupus erythematosus, unspecified: Principal | ICD-10-CM

## 2023-02-02 MED ORDER — HYDROXYCHLOROQUINE 200 MG TABLET
ORAL_TABLET | Freq: Every day | ORAL | 3 refills | 90 days | Status: CP
Start: 2023-02-02 — End: 2024-02-02

## 2023-04-19 DIAGNOSIS — M329 Systemic lupus erythematosus, unspecified: Principal | ICD-10-CM

## 2023-04-19 MED ORDER — PREDNISONE 5 MG TABLET
ORAL_TABLET | ORAL | 0 refills | 20.00 days | Status: CP
Start: 2023-04-19 — End: 2023-05-09

## 2023-05-03 MED ORDER — MIRTAZAPINE 15 MG TABLET
ORAL_TABLET | 5 refills | 0.00 days | Status: CP
Start: 2023-05-03 — End: ?

## 2023-06-01 DIAGNOSIS — T148XXA Other injury of unspecified body region, initial encounter: Principal | ICD-10-CM

## 2023-08-01 ENCOUNTER — Ambulatory Visit
Admit: 2023-08-01 | Discharge: 2023-08-02 | Payer: TRICARE (CHAMPUS) | Attending: Internal Medicine | Primary: Internal Medicine

## 2023-08-01 DIAGNOSIS — M329 Systemic lupus erythematosus, unspecified: Principal | ICD-10-CM

## 2023-08-01 DIAGNOSIS — R6881 Early satiety: Principal | ICD-10-CM

## 2023-08-01 MED ORDER — BELIMUMAB 200 MG/ML SUBCUTANEOUS AUTO-INJECTOR
SUBCUTANEOUS | 3 refills | 0.00 days | Status: CP
Start: 2023-08-01 — End: 2024-07-31

## 2023-09-21 ENCOUNTER — Other Ambulatory Visit: Payer: Self-pay

## 2023-09-21 DIAGNOSIS — M2550 Pain in unspecified joint: Secondary | ICD-10-CM | POA: Diagnosis present

## 2023-09-21 DIAGNOSIS — M3210 Systemic lupus erythematosus, organ or system involvement unspecified: Secondary | ICD-10-CM | POA: Diagnosis not present

## 2023-09-21 LAB — CBC WITH DIFFERENTIAL/PLATELET
Abs Immature Granulocytes: 0.01 10*3/uL (ref 0.00–0.07)
Basophils Absolute: 0 10*3/uL (ref 0.0–0.1)
Basophils Relative: 1 %
Eosinophils Absolute: 0.1 10*3/uL (ref 0.0–0.5)
Eosinophils Relative: 2 %
HCT: 35.2 % — ABNORMAL LOW (ref 36.0–46.0)
Hemoglobin: 12.5 g/dL (ref 12.0–15.0)
Immature Granulocytes: 0 %
Lymphocytes Relative: 23 %
Lymphs Abs: 0.7 10*3/uL (ref 0.7–4.0)
MCH: 32.5 pg (ref 26.0–34.0)
MCHC: 35.5 g/dL (ref 30.0–36.0)
MCV: 91.4 fL (ref 80.0–100.0)
Monocytes Absolute: 0.4 10*3/uL (ref 0.1–1.0)
Monocytes Relative: 13 %
Neutro Abs: 1.7 10*3/uL (ref 1.7–7.7)
Neutrophils Relative %: 61 %
Platelets: 290 10*3/uL (ref 150–400)
RBC: 3.85 MIL/uL — ABNORMAL LOW (ref 3.87–5.11)
RDW: 10.6 % — ABNORMAL LOW (ref 11.5–15.5)
WBC: 2.8 10*3/uL — ABNORMAL LOW (ref 4.0–10.5)
nRBC: 0 % (ref 0.0–0.2)

## 2023-09-21 LAB — URINALYSIS, ROUTINE W REFLEX MICROSCOPIC
Bilirubin Urine: NEGATIVE
Glucose, UA: NEGATIVE mg/dL
Hgb urine dipstick: NEGATIVE
Ketones, ur: NEGATIVE mg/dL
Leukocytes,Ua: NEGATIVE
Nitrite: NEGATIVE
Protein, ur: NEGATIVE mg/dL
Specific Gravity, Urine: >=1.03 (ref 1.005–1.030)
pH: 6.5 (ref 5.0–8.0)

## 2023-09-21 LAB — BASIC METABOLIC PANEL WITH GFR
Anion gap: 13 (ref 5–15)
BUN: 15 mg/dL (ref 6–20)
CO2: 25 mmol/L (ref 22–32)
Calcium: 9.3 mg/dL (ref 8.9–10.3)
Chloride: 101 mmol/L (ref 98–111)
Creatinine, Ser: 0.84 mg/dL (ref 0.44–1.00)
GFR, Estimated: >60 mL/min (ref >60)
Glucose, Bld: 88 mg/dL (ref 70–99)
Potassium: 3.5 mmol/L (ref 3.5–5.1)
Sodium: 138 mmol/L (ref 135–145)

## 2023-09-21 LAB — PREGNANCY, URINE: Preg Test, Ur: NEGATIVE

## 2023-09-21 NOTE — ED Triage Notes (Signed)
 Pt arrives with c/o generalized joint pain that started today. Pt reports that she believes she is having a lupus flare up. Pt denies cough, congestion, or fevers.

## 2023-09-22 ENCOUNTER — Emergency Department (HOSPITAL_BASED_OUTPATIENT_CLINIC_OR_DEPARTMENT_OTHER)
Admission: EM | Admit: 2023-09-22 | Discharge: 2023-09-22 | Disposition: A | Discharge status: Home / Self Care | Attending: Emergency Medicine | Admitting: Emergency Medicine

## 2023-09-22 ENCOUNTER — Ambulatory Visit: Admit: 2023-09-22 | Discharge: 2023-09-22 | Payer: TRICARE (CHAMPUS) | Attending: Family | Primary: Family

## 2023-09-22 ENCOUNTER — Encounter: Admit: 2023-09-22 | Discharge: 2023-09-22 | Payer: TRICARE (CHAMPUS) | Attending: Family | Primary: Family

## 2023-09-22 DIAGNOSIS — M329 Systemic lupus erythematosus, unspecified: Principal | ICD-10-CM

## 2023-09-22 DIAGNOSIS — M3219 Other organ or system involvement in systemic lupus erythematosus: Secondary | ICD-10-CM

## 2023-09-22 DIAGNOSIS — M2550 Pain in unspecified joint: Secondary | ICD-10-CM

## 2023-09-22 MED ORDER — PREDNISONE 5 MG TABLET
ORAL_TABLET | ORAL | 0 refills | 16.00000 days | Status: CP
Start: 2023-09-22 — End: 2023-10-08

## 2023-09-22 MED ORDER — LIDOCAINE 5 % EX PTCH
3.0000 | MEDICATED_PATCH | CUTANEOUS | Status: DC
  Administered 2023-09-22: 3 via TRANSDERMAL
  Filled 2023-09-22: qty 3

## 2023-09-22 MED ORDER — PREDNISONE 20 MG PO TABS: ORAL_TABLET | ORAL | 0 refills | Status: AC

## 2023-09-22 MED ORDER — LIDOCAINE 5 % EX PTCH: 1.0000 | MEDICATED_PATCH | CUTANEOUS | 0 refills | Status: AC

## 2023-09-22 MED ORDER — DEXAMETHASONE SODIUM PHOSPHATE 4 MG/ML IJ SOLN
4.0000 mg | Freq: Once | INTRAMUSCULAR | Status: AC
  Administered 2023-09-22: 4 mg via INTRAMUSCULAR
  Filled 2023-09-22: qty 1

## 2023-09-22 NOTE — ED Provider Notes (Signed)
 [place] [facility] Provider Note   CSN: [id number] Arrival date & time: 09/21/23  2300     Patient presents with: Joint Pain   Tonya Gaines is a 28 y.o. female.   The history is provided by the patient.  Illness Location:  Shoulders and knees Quality:  Painful Severity:  Moderate Onset quality:  Gradual Duration:  1 day Timing:  Constant Progression:  Unchanged Chronicity:  Recurrent Context:  Patient with SLE Relieved by:  Nothing Worsened by:  Nothing Ineffective treatments:  Tylenol  Associated symptoms: no cough, no fever, no rash and no wheezing   Risk factors:  Lupus     Past Medical History:  Diagnosis Date   Lupus      Prior to Admission medications   Medication Sig Start Date End Date Taking? Authorizing Provider  lidocaine  (LIDODERM ) 5 % Place 1 patch onto the skin daily. Remove & Discard patch within 12 hours or as directed by MD 09/22/23  Yes Tiphanie Vo, MD  predniSONE (DELTASONE) 20 MG tablet 3 tabs po day one, then 2 po daily x 4 days 09/22/23  Yes Cyra Spader, MD  azithromycin  (ZITHROMAX  Z-PAK) 250 MG tablet Take 1 tablet (250 mg total) by mouth daily. On day 1 take 2 tablets.  After that take 1 tablet daily. 09/11/22   Ceclia Cohens, Amjad, PA-C  Belimumab 200 MG/ML SOAJ Inject 200 mg into the skin every 30 (thirty) days. 11/23/21   [provider]  elvitegravir-cobicistat-emtricitabine-tenofovir (GENVOYA ) 150-150-200-10 MG TABS tablet Take 1 tablet by mouth daily with breakfast. 06/11/22   Guadalupe Lee, MD  elvitegravir-cobicistat-emtricitabine-tenofovir (GENVOYA ) 150-150-200-10 MG TABS tablet Take by mouth. 06/11/22   Guadalupe Lee, MD  HYDROcodone  bit-homatropine (HYCODAN) 5-1.5 MG/5ML syrup Take 5 mLs by mouth every 6 (six) hours as needed for cough. 09/11/22   Lucina Sabal, PA-C  hydroxychloroquine (PLAQUENIL) 200 MG tablet Take 200 mg by mouth every evening. 11/06/20   [provider]  lamoTRIgine  (LAMICTAL) 25 MG tablet Take 25 mg by mouth 2 (two) times daily. 07/07/21 07/08/22  [provider]    Allergies: Amoxicillin, Penicillin g, Sertraline, and Penicillins    Review of Systems  Constitutional:  Negative for fever.  Respiratory:  Negative for cough and wheezing.   Musculoskeletal:  Positive for arthralgias.  Skin:  Negative for rash.  All other systems reviewed and are negative.   Updated Vital Signs BP 110/85 (BP Location: Left Arm)   Pulse 95   Temp 98.4 F (36.9 C) (Oral)   Resp 18   Ht 5' 4 (1.626 m)   Wt 57.6 kg   SpO2 99%   BMI 21.80 kg/m   Physical Exam Vitals and nursing note reviewed.  Constitutional:      General: She is not in acute distress.    Appearance: Normal appearance. She is well-developed.  HENT:     Head: Normocephalic and atraumatic.     Nose: Nose normal.   Eyes:     Pupils: Pupils are equal, round, and reactive to light.    Cardiovascular:     Rate and Rhythm: Normal rate and regular rhythm.     Pulses: Normal pulses.     Heart sounds: Normal heart sounds.  Pulmonary:     Effort: Pulmonary effort is normal. No respiratory distress.     Breath sounds: Normal breath sounds.  Abdominal:     General: Bowel sounds are normal. There is no distension.     Palpations: Abdomen is  soft.     Tenderness: There is no abdominal tenderness. There is no guarding or rebound.   Musculoskeletal:        General: No swelling, tenderness or deformity. Normal range of motion.     Cervical back: Normal range of motion and neck supple.     Comments: No warmth or swelling    Skin:    General: Skin is warm and dry.     Capillary Refill: Capillary refill takes less than 2 seconds.     Findings: No erythema or rash.   Neurological:     General: No focal deficit present.     Mental Status: She is alert.     Deep Tendon Reflexes: Reflexes normal.   Psychiatric:        Mood and Affect: Mood normal.     (all labs ordered are listed,  but only abnormal results are displayed) Results for orders placed or performed during the hospital encounter of 09/22/23  CBC with Differential   Collection Time: 09/21/23 11:19 PM  Result Value Ref Range   WBC 2.8 (L) 4.0 - 10.5 K/uL   RBC 3.85 (L) 3.87 - 5.11 MIL/uL   Hemoglobin 12.5 12.0 - 15.0 g/dL   HCT 91.4 (L) 78.2 - 95.6 %   MCV 91.4 80.0 - 100.0 fL   MCH 32.5 26.0 - 34.0 pg   MCHC 35.5 30.0 - 36.0 g/dL   RDW 21.3 (L) 08.6 - 57.8 %   Platelets 290 150 - 400 K/uL   nRBC 0.0 0.0 - 0.2 %   Neutrophils Relative % 61 %   Neutro Abs 1.7 1.7 - 7.7 K/uL   Lymphocytes Relative 23 %   Lymphs Abs 0.7 0.7 - 4.0 K/uL   Monocytes Relative 13 %   Monocytes Absolute 0.4 0.1 - 1.0 K/uL   Eosinophils Relative 2 %   Eosinophils Absolute 0.1 0.0 - 0.5 K/uL   Basophils Relative 1 %   Basophils Absolute 0.0 0.0 - 0.1 K/uL   Immature Granulocytes 0 %   Abs Immature Granulocytes 0.01 0.00 - 0.07 K/uL  Basic metabolic panel   Collection Time: 09/21/23 11:19 PM  Result Value Ref Range   Sodium 138 135 - 145 mmol/L   Potassium 3.5 3.5 - 5.1 mmol/L   Chloride 101 98 - 111 mmol/L   CO2 25 22 - 32 mmol/L   Glucose, Bld 88 70 - 99 mg/dL   BUN 15 6 - 20 mg/dL   Creatinine, Ser 4.69 0.44 - 1.00 mg/dL   Calcium 9.3 8.9 - 62.9 mg/dL   GFR, Estimated >52 >84 mL/min   Anion gap 13 5 - 15  Pregnancy, urine   Collection Time: 09/21/23 11:19 PM  Result Value Ref Range   Preg Test, Ur NEGATIVE NEGATIVE  Urinalysis, Routine w reflex microscopic -Urine, Clean Catch   Collection Time: 09/21/23 11:19 PM  Result Value Ref Range   Color, Urine YELLOW YELLOW   APPearance CLEAR CLEAR   Specific Gravity, Urine >=1.030 1.005 - 1.030   pH 6.5 5.0 - 8.0   Glucose, UA NEGATIVE NEGATIVE mg/dL   Hgb urine dipstick NEGATIVE NEGATIVE   Bilirubin Urine NEGATIVE NEGATIVE   Ketones, ur NEGATIVE NEGATIVE mg/dL   Protein, ur NEGATIVE NEGATIVE mg/dL   Nitrite NEGATIVE NEGATIVE   Leukocytes,Ua NEGATIVE NEGATIVE    No results found.   Radiology: No results found.   Procedures   Medications Ordered in the ED  lidocaine  (LIDODERM ) 5 % 3 patch (3  patches Transdermal Patch Applied 09/22/23 0112)  dexamethasone (DECADRON) injection 4 mg (4 mg Intramuscular Given 09/22/23 0113)                                    Medical Decision Making Patient with lupus presenting with joint pain.    Amount and/or Complexity of Data Reviewed External Data Reviewed: notes.    Details: Previous notes reviewed  Labs: ordered.    Details: Pregnancy negative urine is negative for uti.  Normal sodium 138, normal potassium 3.5, normal creatinine.  White count is low but consistent with previous 2.8, normal hemoglobin 12.5, normal platelets 290  Risk Prescription drug management. Risk Details: Well appearing.  No signs of sepsis or joint infections.  Will start steroid therapy and refer patient back to her rheumatologist for ongoing care.  Stable for discharge.       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 The patient is nontoxic-appearing on exam and vital signs are within normal limits.  I have reviewed the triage vital signs and the nursing notes. Pertinent labs & imaging results that were available during my care of the patient were reviewed by me and considered in my medical decision making (see chart for details). After history, exam, and medical workup I feel the patient has been appropriately medically screened and is safe for discharge home. Pertinent diagnoses were discussed with the patient. Patient was given return precautions.    ED Discharge Orders          Ordered    predniSONE (DELTASONE) 20 MG tablet        09/22/23 0103    lidocaine  (LIDODERM ) 5 %  Every 24 hours        09/22/23 0110               Brennin Durfee, MD 09/22/23 0209

## 2023-10-10 ENCOUNTER — Emergency Department (HOSPITAL_COMMUNITY)
Admission: EM | Admit: 2023-10-10 | Discharge: 2023-10-10 | Disposition: A | Discharge status: Home / Self Care | Attending: Emergency Medicine | Admitting: Emergency Medicine

## 2023-10-10 ENCOUNTER — Other Ambulatory Visit: Payer: Self-pay

## 2023-10-10 DIAGNOSIS — T7840XA Allergy, unspecified, initial encounter: Secondary | ICD-10-CM | POA: Diagnosis present

## 2023-10-10 DIAGNOSIS — T782XXA Anaphylactic shock, unspecified, initial encounter: Secondary | ICD-10-CM | POA: Insufficient documentation

## 2023-10-10 DIAGNOSIS — R42 Dizziness and giddiness: Secondary | ICD-10-CM | POA: Insufficient documentation

## 2023-10-10 MED ORDER — PREDNISONE 20 MG PO TABS
20.0000 mg | ORAL_TABLET | Freq: Once | ORAL | Status: AC
  Administered 2023-10-10: 20 mg via ORAL
  Filled 2023-10-10: qty 1

## 2023-10-10 MED ORDER — DIPHENHYDRAMINE HCL 25 MG PO CAPS
50.0000 mg | ORAL_CAPSULE | Freq: Once | ORAL | Status: AC
  Administered 2023-10-10: 50 mg via ORAL
  Filled 2023-10-10: qty 2

## 2023-10-10 MED ORDER — EPINEPHRINE 0.3 MG/0.3ML IJ SOAJ
0.3000 mg | INTRAMUSCULAR | 0 refills | Status: AC | PRN
Start: 2023-10-10 — End: ?

## 2023-10-10 NOTE — ED Triage Notes (Signed)
 Patient took Diflucan  at home, has previously taken it and gotten a rash.  This time patient has some numbness, tingling and swelling of lips and throat feels partially obstructed.  Patient got 0.5 epi by EMS and took 25 mg Benadryl herself.  Patient is able to speak full sentences and O2 sats are stable.

## 2023-10-10 NOTE — ED Provider Notes (Signed)
 [place] [facility] AT [facility] Provider Note   CSN: [id number] Arrival date & time: 10/10/23  0046     History Chief Complaint  Patient presents with   Allergic Reaction    HPI Tonya Gaines is a 28 y.o. female presenting for chief complaint of allergic reaction to diflucan . Lightheaded and dizzy and throat discomfort History of similar though less severe reactions in the past. Patient's recorded medical, surgical, social, medication list and allergies were reviewed in the Snapshot window as part of the initial history.   Review of Systems   Review of Systems  Constitutional:  Negative for chills and fever.  HENT:  Positive for sore throat. Negative for ear pain.   Eyes:  Negative for pain and visual disturbance.  Respiratory:  Negative for cough and shortness of breath.   Cardiovascular:  Negative for chest pain and palpitations.  Gastrointestinal:  Negative for abdominal pain and vomiting.  Genitourinary:  Negative for dysuria and hematuria.  Musculoskeletal:  Negative for arthralgias and back pain.  Skin:  Positive for rash. Negative for color change.  Neurological:  Positive for dizziness. Negative for seizures and syncope.  All other systems reviewed and are negative.   Physical Exam Updated Vital Signs BP 105/74   Pulse 94   Temp 98.4 F (36.9 C) (Oral)   Resp 18   SpO2 100%  Physical Exam Vitals and nursing note reviewed.  Constitutional:      General: She is not in acute distress.    Appearance: She is well-developed.  HENT:     Head: Normocephalic and atraumatic.   Eyes:     Conjunctiva/sclera: Conjunctivae normal.    Cardiovascular:     Rate and Rhythm: Normal rate and regular rhythm.     Heart sounds: No murmur heard. Pulmonary:     Effort: Pulmonary effort is normal. No respiratory distress.     Breath sounds: Normal breath sounds.  Abdominal:     General: There is no distension.     Palpations: Abdomen is soft.      Tenderness: There is no abdominal tenderness. There is no right CVA tenderness or left CVA tenderness.   Musculoskeletal:        General: No swelling or tenderness. Normal range of motion.     Cervical back: Neck supple.   Skin:    General: Skin is warm and dry.   Neurological:     General: No focal deficit present.     Mental Status: She is alert and oriented to person, place, and time. Mental status is at baseline.     Cranial Nerves: No cranial nerve deficit.      ED Course/ Medical Decision Making/ A&P    Procedures Procedures   Medications Ordered in ED Medications  diphenhydrAMINE (BENADRYL) capsule 50 mg (50 mg Oral Given 10/10/23 0122)  predniSONE  (DELTASONE ) tablet 20 mg (20 mg Oral Given 10/10/23 0123)   Medical Decision Making:   Tonya Gaines is a 28 y.o. female who presented to the ED today with rash,hives, pharyngeal irritation, GI Upset, respiratory symptoms; detailed above.    Patient placed on continuous vitals and telemetry monitoring while in ED which was reviewed periodically.  Complete initial physical exam performed, notably the patient  was HDS stable in NAD.    Reviewed and confirmed nursing documentation for past medical history, family history, social history.    Initial Assessment/plan:   Patient's constellation of symptoms are most consistent with an anaphylactic reaction. This is  due to the multisystem involvement.  I was called emergently to bedside to evaluate. This is most consistent with an acute life/limb threatening illness complicated by underlying chronic conditions.  Initial Plan:  Symptomatic management with histamine blockade including diphenhydramine, famotidine  and steroid.  Patient will be observed for progression of disease despite administration of medication. Patient meets criteria for epinephrine administration due to anaphylactic nature of reaction.  Patient will be observed for 3 hours after administration to ensure resolution of  underlying syndrome and no need for repeat dosing. Objective evaluation as below reviewed     Reassessment and Plan:   After observation window, patient is grossly improved. Discussed ongoing supportive care and management with medications in the outpatient setting and patient feels comfortable discharge.    Clinical Impression:  1. Anaphylaxis, initial encounter      Discharge   Final Clinical Impression(s) / ED Diagnoses Final diagnoses:  Anaphylaxis, initial encounter    Rx / DC Orders ED Discharge Orders          Ordered    EPINEPHrine 0.3 mg/0.3 mL IJ SOAJ injection  As needed        10/10/23 0114              Jerral Meth, MD 10/10/23 [phone]

## 2023-11-21 ENCOUNTER — Encounter (HOSPITAL_BASED_OUTPATIENT_CLINIC_OR_DEPARTMENT_OTHER): Payer: Self-pay | Admitting: Urology

## 2023-11-21 ENCOUNTER — Emergency Department (HOSPITAL_BASED_OUTPATIENT_CLINIC_OR_DEPARTMENT_OTHER): Admission: EM | Admit: 2023-11-21 | Discharge: 2023-11-21 | Disposition: A | Discharge status: Home / Self Care

## 2023-11-21 ENCOUNTER — Other Ambulatory Visit: Payer: Self-pay

## 2023-11-21 ENCOUNTER — Emergency Department (HOSPITAL_BASED_OUTPATIENT_CLINIC_OR_DEPARTMENT_OTHER)

## 2023-11-21 DIAGNOSIS — R300 Dysuria: Secondary | ICD-10-CM | POA: Diagnosis present

## 2023-11-21 DIAGNOSIS — R1030 Lower abdominal pain, unspecified: Secondary | ICD-10-CM | POA: Diagnosis not present

## 2023-11-21 DIAGNOSIS — R110 Nausea: Secondary | ICD-10-CM | POA: Diagnosis not present

## 2023-11-21 DIAGNOSIS — R109 Unspecified abdominal pain: Secondary | ICD-10-CM

## 2023-11-21 LAB — URINALYSIS, ROUTINE W REFLEX MICROSCOPIC
Bilirubin Urine: NEGATIVE
Glucose, UA: NEGATIVE mg/dL
Hgb urine dipstick: NEGATIVE
Ketones, ur: NEGATIVE mg/dL
Leukocytes,Ua: NEGATIVE
Nitrite: NEGATIVE
Protein, ur: NEGATIVE mg/dL
Specific Gravity, Urine: <=1.005 (ref 1.005–1.030)
pH: 6 (ref 5.0–8.0)

## 2023-11-21 LAB — CBC
HCT: 34.6 % — ABNORMAL LOW (ref 36.0–46.0)
Hemoglobin: 12.2 g/dL (ref 12.0–15.0)
MCH: 32.6 pg (ref 26.0–34.0)
MCHC: 35.3 g/dL (ref 30.0–36.0)
MCV: 92.5 fL (ref 80.0–100.0)
Platelets: 241 K/uL (ref 150–400)
RBC: 3.74 MIL/uL — ABNORMAL LOW (ref 3.87–5.11)
RDW: 10.7 % — ABNORMAL LOW (ref 11.5–15.5)
WBC: 3 K/uL — ABNORMAL LOW (ref 4.0–10.5)
nRBC: 0 % (ref 0.0–0.2)

## 2023-11-21 LAB — COMPREHENSIVE METABOLIC PANEL WITH GFR
ALT: 6 U/L (ref 0–44)
AST: 20 U/L (ref 15–41)
Albumin: 4.5 g/dL (ref 3.5–5.0)
Alkaline Phosphatase: 65 U/L (ref 38–126)
Anion gap: 11 (ref 5–15)
BUN: 10 mg/dL (ref 6–20)
CO2: 23 mmol/L (ref 22–32)
Calcium: 9.3 mg/dL (ref 8.9–10.3)
Chloride: 105 mmol/L (ref 98–111)
Creatinine, Ser: 0.76 mg/dL (ref 0.44–1.00)
GFR, Estimated: >60 mL/min (ref >60)
Glucose, Bld: 87 mg/dL (ref 70–99)
Potassium: 3.8 mmol/L (ref 3.5–5.1)
Sodium: 139 mmol/L (ref 135–145)
Total Bilirubin: 0.6 mg/dL (ref 0.0–1.2)
Total Protein: 7.6 g/dL (ref 6.5–8.1)

## 2023-11-21 LAB — PREGNANCY, URINE: Preg Test, Ur: NEGATIVE

## 2023-11-21 MED ORDER — IOHEXOL 300 MG/ML  SOLN
75.0000 mL | Freq: Once | INTRAMUSCULAR | Status: AC | PRN
  Administered 2023-11-21 (×2): 75 mL via INTRAVENOUS

## 2023-11-21 MED ORDER — PHENAZOPYRIDINE HCL 200 MG PO TABS: 200.0000 mg | ORAL_TABLET | Freq: Three times a day (TID) | ORAL | 0 refills | Status: AC

## 2023-11-21 MED ORDER — ONDANSETRON HCL 4 MG/2ML IJ SOLN
4.0000 mg | Freq: Once | INTRAMUSCULAR | Status: AC
  Administered 2023-11-21 (×2): 4 mg via INTRAVENOUS
  Filled 2023-11-21: qty 2

## 2023-11-21 MED ORDER — SODIUM CHLORIDE 0.9 % IV BOLUS
1000.0000 mL | Freq: Once | INTRAVENOUS | Status: AC
  Administered 2023-11-21 (×2): 1000 mL via INTRAVENOUS

## 2023-11-21 MED ORDER — CYCLOBENZAPRINE HCL 10 MG PO TABS: 10.0000 mg | ORAL_TABLET | Freq: Two times a day (BID) | ORAL | 0 refills | Status: AC | PRN

## 2023-11-21 MED ORDER — KETOROLAC TROMETHAMINE 15 MG/ML IJ SOLN
15.0000 mg | Freq: Once | INTRAMUSCULAR | Status: AC
  Administered 2023-11-21 (×2): 15 mg via INTRAVENOUS
  Filled 2023-11-21: qty 1

## 2023-11-21 NOTE — Discharge Instructions (Signed)
 As discussed, your CT scan appeared normal.  No obvious kidney stone, abscess or infections of the kidneys.  Your urine was completely normal as well.  I suspect the antibiotics you are currently on are treating the presumed urine infection.  Continue to take these to their complete course.  Will send you home with medicine to take as needed for pain.  You may continue to take Tylenol  along with these medications.  Please do not hesitate to return to emergency department if your symptoms and symptoms are discussed become apparent.

## 2023-11-21 NOTE — ED Triage Notes (Signed)
 Pt states approx 2 weeks ago dx with UTI, was seen again yesterday and told she had kidney infection, was given injection of antibiotics  States symptoms are now worse  States bilateral flank pain  Denies fever

## 2023-11-21 NOTE — ED Notes (Signed)
 Pt to CT

## 2023-11-21 NOTE — ED Notes (Signed)
 Pt d/c home per EDP order. Discharge summary reviewed, pt verbalizes understanding. Ambulatory off unit. NAD.

## 2023-11-21 NOTE — ED Provider Notes (Signed)
 [place] [facility] Provider Note   CSN: [id number] Arrival date & time: 11/21/23  1408     Patient presents with: Flank Pain   Tonya Gaines is a 28 y.o. female.    Flank Pain   28 year old female presents emergency department with complaints of flank pain, dysuria, malodorous urine.  Symptoms initially began 2 weeks ago when she was having dysuria as well as malodorous urine.  Was placed on Macrobid at that time 7-day course of which she took 2 days entirety.  Reports continuation of symptoms thereafter.  Was seen yesterday at the [facility] diagnosed with kidney infection based on urine and her symptoms and was given 1 shot of Rocephin  there and has taken 1 dose of cefdinir ever since being there.  States that her symptoms are not improved since yesterday prompting visit to the emergency department.  Reports nausea with no emesis.  Denies any fevers, chills, vaginal symptoms, change in bowel habits.  States that she is having right greater than left flank pain.  States she is also having pain in her middle lower abdomen.  Past medical history significant for lupus, appendicitis with appendectomy.  Prior to Admission medications   Medication Sig Start Date End Date Taking? Authorizing Provider  azithromycin  (ZITHROMAX  Z-PAK) 250 MG tablet Take 1 tablet (250 mg total) by mouth daily. On day 1 take 2 tablets.  After that take 1 tablet daily. 09/11/22   Hildegard, Amjad, PA-C  Belimumab 200 MG/ML SOAJ Inject 200 mg into the skin every 30 (thirty) days. 11/23/21   [provider]  elvitegravir-cobicistat-emtricitabine-tenofovir (GENVOYA ) 150-150-200-10 MG TABS tablet Take 1 tablet by mouth daily with breakfast. 06/11/22   Bernard Drivers, MD  elvitegravir-cobicistat-emtricitabine-tenofovir (GENVOYA ) 150-150-200-10 MG TABS tablet Take by mouth. 06/11/22   Bernard Drivers, MD  EPINEPHrine  0.3 mg/0.3 mL IJ SOAJ injection Inject 0.3 mg into the muscle as needed for  anaphylaxis. 10/10/23   Jerral Meth, MD  HYDROcodone  bit-homatropine (HYCODAN) 5-1.5 MG/5ML syrup Take 5 mLs by mouth every 6 (six) hours as needed for cough. 09/11/22   Hildegard Loge, PA-C  hydroxychloroquine (PLAQUENIL) 200 MG tablet Take 200 mg by mouth every evening. 11/06/20   [provider]  lamoTRIgine (LAMICTAL) 25 MG tablet Take 25 mg by mouth 2 (two) times daily. 07/07/21 07/08/22  [provider]  lidocaine  (LIDODERM ) 5 % Place 1 patch onto the skin daily. Remove & Discard patch within 12 hours or as directed by MD 09/22/23   Nettie, April, MD  predniSONE  (DELTASONE ) 20 MG tablet 3 tabs po day one, then 2 po daily x 4 days 09/22/23   Palumbo, April, MD    Allergies: Amoxicillin, Penicillin g, Sertraline, and Penicillins    Review of Systems  Genitourinary:  Positive for flank pain.  All other systems reviewed and are negative.   Updated Vital Signs BP 105/72 (BP Location: Left Arm)   Pulse 90   Temp 98.5 F (36.9 C)   Resp 18   Ht 5' 4 (1.626 m)   Wt 57.6 kg   SpO2 97%   BMI 21.80 kg/m   Physical Exam Vitals and nursing note reviewed.  Constitutional:      General: She is not in acute distress.    Appearance: She is well-developed.  HENT:     Head: Normocephalic and atraumatic.  Eyes:     Conjunctiva/sclera: Conjunctivae normal.  Cardiovascular:     Rate and Rhythm: Normal rate and regular rhythm.  Heart sounds: No murmur heard. Pulmonary:     Effort: Pulmonary effort is normal. No respiratory distress.     Breath sounds: Normal breath sounds.  Abdominal:     Palpations: Abdomen is soft.     Tenderness: There is abdominal tenderness in the suprapubic area. There is right CVA tenderness. There is no left CVA tenderness. Negative signs include Murphy's sign and McBurney's sign.  Musculoskeletal:        General: No swelling.     Cervical back: Neck supple.  Skin:    General: Skin is warm and dry.     Capillary Refill: Capillary refill takes  less than 2 seconds.  Neurological:     Mental Status: She is alert.  Psychiatric:        Mood and Affect: Mood normal.     (all labs ordered are listed, but only abnormal results are displayed) Labs Reviewed  COMPREHENSIVE METABOLIC PANEL WITH GFR  CBC  URINALYSIS, ROUTINE W REFLEX MICROSCOPIC  PREGNANCY, URINE    EKG: None  Radiology: No results found.   Procedures   Medications Ordered in the ED  sodium chloride  0.9 % bolus 1,000 mL (has no administration in time range)  ondansetron  (ZOFRAN ) injection 4 mg (has no administration in time range)                                    Medical Decision Making Amount and/or Complexity of Data Reviewed Labs: ordered. Radiology: ordered.  Risk Prescription drug management.   This patient presents to the ED for concern of flank pain, urinary symptoms, this involves an extensive number of treatment options, and is a complaint that carries with it a high risk of complications and morbidity.  The differential diagnosis includes cystitis, proctitis, nephrolithiasis, migraine chest, ectopic pregnancy, ovarian torsion, diverticulitis, or other   Co morbidities that complicate the patient evaluation  See HPI   Additional history obtained:  Additional history obtained from EMR External records from outside source obtained and reviewed including hospital records   Lab Tests:  I Ordered, and personally interpreted labs.  The pertinent results include:  -Leukopenia, cell count of 3.0 which is chronic.  No evidence of anemia.  Platelets within range.  No major abnormalities.  No transaminitis.  No renal dysfunction.  UA without abnormality.  Urine pregnancy negative.   Imaging Studies ordered:  I ordered imaging studies including CT abdomen pelvis I independently visualized and interpreted imaging which showed no acute abnormality I agree with the radiologist interpretation   Cardiac Monitoring: / EKG:  The patient  was maintained on a cardiac monitor.  I personally viewed and interpreted the cardiac monitored which showed an underlying rhythm of: Sinus rhythm   Consultations Obtained:  N/a   Problem List / ED Course / Critical interventions / Medication management  Flank pain I ordered medication including normal saline 1 L, Zofran , Toradol  Reevaluation of the patient after these medicines showed that the patient improved I have reviewed the patients home medicines and have made adjustments as needed   Social Determinants of Health:  Denies tobacco or illicit drug use.   Test / Admission - Considered:  Flank pain, dysuria Vitals signs within normal range and stable throughout visit. Laboratory/imaging studies significant for: See above 28 year old female presents emergency department with complaints of flank pain, dysuria, malodorous urine.  Symptoms initially began 2 weeks ago when she was having dysuria as  well as malodorous urine.  Was placed on Macrobid at that time 7-day course of which she took 2 days entirety.  Reports continuation of symptoms thereafter.  Was seen yesterday at the [facility] diagnosed with kidney infection based on urine and her symptoms and was given 1 shot of Rocephin  there and has taken 1 dose of cefdinir ever since being there.  States that her symptoms are not improved since yesterday prompting visit to the emergency department.  Reports nausea with no emesis.  Denies any fevers, chills, vaginal symptoms, change in bowel habits.  States that she is having right greater than left flank pain.  States she is also having pain in her middle lower abdomen. On exam, right-sided CVA tenderness as well as some suprapubic tenderness.  Labs unremarkable for any acute emergent process.  CT imaging negative.  Given patient's reported infected UA yesterday, concern for possible pyelonephritis.  Patient is on adequate antibiotics in the form of cefdinir and does have urine culture pending  from provider yesterday this time.  Recommend continued antibiotic use.  Will recommend symptomatic therapy as an AVS with follow-up with primary care for reassessment.  Treatment plan discussed with patient and she was understanding was agreeable to said plan.  Patient well-appearing, afebrile, no acute distress. Worrisome signs and symptoms were discussed with the patient, and the patient acknowledged understanding to return to the ED if noticed. Patient was stable upon discharge.       Final diagnoses:  None    ED Discharge Orders     None          Silver Wonda LABOR, [place] 11/21/23 1637    Neysa Caron PARAS, DO 11/22/23 [id number]

## 2023-11-23 ENCOUNTER — Ambulatory Visit: Admit: 2023-11-23 | Discharge: 2023-11-24 | Payer: TRICARE (CHAMPUS) | Attending: Family | Primary: Family

## 2023-11-23 ENCOUNTER — Encounter: Admit: 2023-11-23 | Discharge: 2023-11-24 | Payer: TRICARE (CHAMPUS) | Attending: Family | Primary: Family

## 2023-11-23 DIAGNOSIS — Z79899 Other long term (current) drug therapy: Principal | ICD-10-CM

## 2023-11-23 DIAGNOSIS — M329 Systemic lupus erythematosus, unspecified: Principal | ICD-10-CM

## 2023-11-23 MED ORDER — HYDROXYCHLOROQUINE 200 MG TABLET
ORAL_TABLET | Freq: Every day | ORAL | 3 refills | 90.00000 days | Status: CP
Start: 2023-11-23 — End: 2024-11-22

## 2023-12-20 MED ORDER — MIRTAZAPINE 15 MG TABLET
ORAL_TABLET | ORAL | 5 refills | 0.00000 days | Status: CP
Start: 2023-12-20 — End: ?

## 2024-01-31 ENCOUNTER — Ambulatory Visit
Admit: 2024-01-31 | Payer: TRICARE (CHAMPUS) | Attending: Student in an Organized Health Care Education/Training Program | Primary: Student in an Organized Health Care Education/Training Program

## 2024-02-14 DIAGNOSIS — M329 Systemic lupus erythematosus, unspecified: Principal | ICD-10-CM

## 2024-02-14 DIAGNOSIS — Z23 Encounter for immunization: Principal | ICD-10-CM

## 2024-02-14 DIAGNOSIS — G43709 Chronic migraine without aura, not intractable, without status migrainosus: Principal | ICD-10-CM

## 2024-02-14 DIAGNOSIS — Z309 Encounter for contraceptive management, unspecified: Principal | ICD-10-CM

## 2024-02-14 MED ORDER — RIZATRIPTAN 10 MG TABLET
ORAL_TABLET | Freq: Once | ORAL | 2 refills | 0.00000 days | Status: CP | PRN
Start: 2024-02-14 — End: 2025-02-14

## 2024-03-18 DIAGNOSIS — M329 Systemic lupus erythematosus, unspecified: Principal | ICD-10-CM

## 2024-03-21 ENCOUNTER — Encounter
Admit: 2024-03-21 | Discharge: 2024-03-21 | Payer: TRICARE (CHAMPUS) | Attending: Gastroenterology | Primary: Gastroenterology

## 2024-03-21 DIAGNOSIS — R6881 Early satiety: Principal | ICD-10-CM

## 2024-03-21 DIAGNOSIS — K219 Gastro-esophageal reflux disease without esophagitis: Principal | ICD-10-CM

## 2024-03-21 DIAGNOSIS — R112 Nausea with vomiting, unspecified: Principal | ICD-10-CM

## 2024-03-21 DIAGNOSIS — K5900 Constipation, unspecified: Principal | ICD-10-CM

## 2024-03-21 MED ORDER — OMEPRAZOLE 40 MG CAPSULE,DELAYED RELEASE
ORAL_CAPSULE | Freq: Every day | ORAL | 3 refills | 90.00000 days | Status: CP
Start: 2024-03-21 — End: 2025-03-21

## 2024-04-17 ENCOUNTER — Ambulatory Visit
Admit: 2024-04-17 | Discharge: 2024-04-18 | Payer: TRICARE (CHAMPUS) | Attending: Internal Medicine | Primary: Internal Medicine

## 2024-04-17 DIAGNOSIS — M329 Systemic lupus erythematosus, unspecified: Principal | ICD-10-CM

## 2024-04-17 DIAGNOSIS — R300 Dysuria: Principal | ICD-10-CM

## 2024-04-17 MED ORDER — CELECOXIB 100 MG CAPSULE
ORAL_CAPSULE | Freq: Two times a day (BID) | ORAL | 11 refills | 30.00000 days | Status: CP | PRN
Start: 2024-04-17 — End: 2025-04-17
